# Patient Record
Sex: Female | Born: 1990 | Marital: Single | Smoking: Never smoker
Health system: Southern US, Community
[De-identification: ages and names within clinical notes are randomized; demographics above are authoritative.]

## PROBLEM LIST (undated history)

## (undated) ENCOUNTER — Inpatient Hospital Stay (HOSPITAL_COMMUNITY): Payer: Self-pay

## (undated) DIAGNOSIS — Z789 Other specified health status: Secondary | ICD-10-CM

## (undated) HISTORY — PX: NO PAST SURGERIES: SHX2092

---

## 2013-09-02 ENCOUNTER — Ambulatory Visit (INDEPENDENT_AMBULATORY_CARE_PROVIDER_SITE_OTHER): Payer: Medicaid Other | Admitting: Family Medicine

## 2013-09-02 VITALS — BP 122/73 | HR 118 | Temp 98.2°F | Resp 18 | Ht 62.21 in | Wt 91.0 lb

## 2013-09-02 DIAGNOSIS — K089 Disorder of teeth and supporting structures, unspecified: Secondary | ICD-10-CM

## 2013-09-02 DIAGNOSIS — L709 Acne, unspecified: Secondary | ICD-10-CM

## 2013-09-02 DIAGNOSIS — Z603 Acculturation difficulty: Secondary | ICD-10-CM

## 2013-09-02 DIAGNOSIS — L708 Other acne: Secondary | ICD-10-CM

## 2013-09-02 DIAGNOSIS — Z609 Problem related to social environment, unspecified: Secondary | ICD-10-CM

## 2013-09-02 DIAGNOSIS — K0889 Other specified disorders of teeth and supporting structures: Secondary | ICD-10-CM

## 2013-09-02 DIAGNOSIS — R630 Anorexia: Secondary | ICD-10-CM

## 2013-09-02 DIAGNOSIS — R6889 Other general symptoms and signs: Secondary | ICD-10-CM

## 2013-09-02 DIAGNOSIS — R000 Tachycardia, unspecified: Secondary | ICD-10-CM

## 2013-09-02 LAB — COMPREHENSIVE METABOLIC PANEL
ALBUMIN: 3.8 g/dL (ref 3.5–5.2)
ALT: 12 U/L (ref 0–35)
AST: 20 U/L (ref 0–37)
Alkaline Phosphatase: 43 U/L (ref 39–117)
BUN: 8 mg/dL (ref 6–23)
CALCIUM: 8.8 mg/dL (ref 8.4–10.5)
CO2: 25 mEq/L (ref 19–32)
Chloride: 101 mEq/L (ref 96–112)
Creat: 0.42 mg/dL — ABNORMAL LOW (ref 0.50–1.10)
Glucose, Bld: 74 mg/dL (ref 70–99)
POTASSIUM: 3.6 meq/L (ref 3.5–5.3)
SODIUM: 137 meq/L (ref 135–145)
TOTAL PROTEIN: 6.7 g/dL (ref 6.0–8.3)
Total Bilirubin: 0.5 mg/dL (ref 0.2–1.2)

## 2013-09-02 LAB — CBC WITH DIFFERENTIAL/PLATELET
BASOS ABS: 0 10*3/uL (ref 0.0–0.1)
BASOS PCT: 0 % (ref 0–1)
EOS ABS: 0 10*3/uL (ref 0.0–0.7)
Eosinophils Relative: 0 % (ref 0–5)
HCT: 37.8 % (ref 36.0–46.0)
Hemoglobin: 12.8 g/dL (ref 12.0–15.0)
Lymphocytes Relative: 24 % (ref 12–46)
Lymphs Abs: 3.1 10*3/uL (ref 0.7–4.0)
MCH: 28.3 pg (ref 26.0–34.0)
MCHC: 33.9 g/dL (ref 30.0–36.0)
MCV: 83.4 fL (ref 78.0–100.0)
Monocytes Absolute: 0.7 10*3/uL (ref 0.1–1.0)
Monocytes Relative: 5 % (ref 3–12)
NEUTROS PCT: 71 % (ref 43–77)
Neutro Abs: 9.1 10*3/uL — ABNORMAL HIGH (ref 1.7–7.7)
Platelets: 255 10*3/uL (ref 150–400)
RBC: 4.53 MIL/uL (ref 3.87–5.11)
RDW: 13.9 % (ref 11.5–15.5)
WBC: 12.9 10*3/uL — ABNORMAL HIGH (ref 4.0–10.5)

## 2013-09-02 LAB — POCT URINALYSIS DIPSTICK
Bilirubin, UA: NEGATIVE
Glucose, UA: NEGATIVE
KETONES UA: 40
Nitrite, UA: NEGATIVE
PH UA: 6
PROTEIN UA: NEGATIVE
Spec Grav, UA: 1.025
Urobilinogen, UA: 0.2

## 2013-09-02 LAB — POCT UA - MICROSCOPIC ONLY: WBC, Ur, HPF, POC: >20

## 2013-09-02 MED ORDER — BENZOYL PEROXIDE 10 % EX LIQD: CUTANEOUS | Status: DC

## 2013-09-02 MED ORDER — THERA VITAL M PO TABS: 1.0000 | ORAL_TABLET | Freq: Every day | ORAL | Status: DC

## 2013-09-02 MED ORDER — IBUPROFEN 600 MG PO TABS: 600.0000 mg | ORAL_TABLET | Freq: Three times a day (TID) | ORAL | Status: DC | PRN
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
## 2013-09-02 NOTE — Patient Instructions (Signed)
It was good to meet you today.  We will do lab tests today and talk about them next week.  Use the face wash each evening.  Take the medicine when your teeth hurt.  I will refer you to a dentist for Medicaid.

## 2013-09-03 LAB — TSH: TSH: 2.858 u[IU]/mL (ref 0.350–4.500)

## 2013-09-04 DIAGNOSIS — R000 Tachycardia, unspecified: Secondary | ICD-10-CM | POA: Insufficient documentation

## 2013-09-04 DIAGNOSIS — K0889 Other specified disorders of teeth and supporting structures: Secondary | ICD-10-CM | POA: Insufficient documentation

## 2013-09-04 DIAGNOSIS — Z603 Acculturation difficulty: Secondary | ICD-10-CM | POA: Insufficient documentation

## 2013-09-04 DIAGNOSIS — R6889 Other general symptoms and signs: Secondary | ICD-10-CM | POA: Insufficient documentation

## 2013-09-04 DIAGNOSIS — L709 Acne, unspecified: Secondary | ICD-10-CM | POA: Insufficient documentation
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
## 2013-09-04 NOTE — Assessment & Plan Note (Signed)
Benzoyl peroxide to start Will likely need more than this based on degree of acne, possibly antibiotic Discussed decreasing/stopping makeup use to prevent clogging of pores
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
## 2013-09-04 NOTE — Assessment & Plan Note (Signed)
Should be able to acquire dentist with refugee emergency medicaid.  Provided list of dentists which accept medicaid.

## 2013-10-07 ENCOUNTER — Encounter: Payer: Self-pay | Admitting: Family Medicine

## 2013-10-14 ENCOUNTER — Other Ambulatory Visit: Payer: Self-pay

## 2013-10-24 ENCOUNTER — Ambulatory Visit: Payer: Self-pay | Admitting: Family Medicine

## 2013-10-25 ENCOUNTER — Inpatient Hospital Stay (HOSPITAL_COMMUNITY): Payer: Medicaid Other

## 2013-10-25 ENCOUNTER — Encounter (HOSPITAL_COMMUNITY): Payer: Self-pay

## 2013-10-25 ENCOUNTER — Inpatient Hospital Stay (HOSPITAL_COMMUNITY)
Admission: AD | Admit: 2013-10-25 | Discharge: 2013-10-25 | Disposition: A | Discharge status: Home / Self Care | Payer: Medicaid Other | Source: Ambulatory Visit | Attending: Obstetrics & Gynecology | Admitting: Obstetrics & Gynecology

## 2013-10-25 ENCOUNTER — Encounter: Payer: Self-pay | Admitting: Advanced Practice Midwife

## 2013-10-25 DIAGNOSIS — O26899 Other specified pregnancy related conditions, unspecified trimester: Secondary | ICD-10-CM

## 2013-10-25 DIAGNOSIS — O4690 Antepartum hemorrhage, unspecified, unspecified trimester: Secondary | ICD-10-CM | POA: Insufficient documentation

## 2013-10-25 DIAGNOSIS — O0932 Supervision of pregnancy with insufficient antenatal care, second trimester: Secondary | ICD-10-CM | POA: Insufficient documentation

## 2013-10-25 DIAGNOSIS — O9989 Other specified diseases and conditions complicating pregnancy, childbirth and the puerperium: Secondary | ICD-10-CM

## 2013-10-25 DIAGNOSIS — IMO0002 Reserved for concepts with insufficient information to code with codable children: Secondary | ICD-10-CM | POA: Insufficient documentation

## 2013-10-25 DIAGNOSIS — O99891 Other specified diseases and conditions complicating pregnancy: Secondary | ICD-10-CM | POA: Insufficient documentation

## 2013-10-25 DIAGNOSIS — R109 Unspecified abdominal pain: Secondary | ICD-10-CM | POA: Insufficient documentation

## 2013-10-25 HISTORY — DX: Other specified health status: Z78.9

## 2013-10-25 LAB — CBC
HCT: 33.5 % — ABNORMAL LOW (ref 36.0–46.0)
HEMOGLOBIN: 11.4 g/dL — AB (ref 12.0–15.0)
MCH: 28.9 pg (ref 26.0–34.0)
MCHC: 34 g/dL (ref 30.0–36.0)
MCV: 84.8 fL (ref 78.0–100.0)
Platelets: 209 10*3/uL (ref 150–400)
RBC: 3.95 MIL/uL (ref 3.87–5.11)
RDW: 13.2 % (ref 11.5–15.5)
WBC: 10.4 10*3/uL (ref 4.0–10.5)

## 2013-10-25 LAB — URINALYSIS, ROUTINE W REFLEX MICROSCOPIC
Bilirubin Urine: NEGATIVE
GLUCOSE, UA: NEGATIVE mg/dL
KETONES UR: NEGATIVE mg/dL
NITRITE: NEGATIVE
Protein, ur: NEGATIVE mg/dL
Specific Gravity, Urine: >1.03 — ABNORMAL HIGH (ref 1.005–1.030)
Urobilinogen, UA: 0.2 mg/dL (ref 0.0–1.0)
pH: 6 (ref 5.0–8.0)

## 2013-10-25 LAB — URINE MICROSCOPIC-ADD ON

## 2013-10-25 LAB — WET PREP, GENITAL
Clue Cells Wet Prep HPF POC: NONE SEEN
Trich, Wet Prep: NONE SEEN

## 2013-10-25 LAB — DIFFERENTIAL
BASOS ABS: 0 10*3/uL (ref 0.0–0.1)
BASOS PCT: 0 % (ref 0–1)
EOS ABS: 0 10*3/uL (ref 0.0–0.7)
Eosinophils Relative: 0 % (ref 0–5)
Lymphocytes Relative: 20 % (ref 12–46)
Lymphs Abs: 2.1 10*3/uL (ref 0.7–4.0)
Monocytes Absolute: 0.8 10*3/uL (ref 0.1–1.0)
Monocytes Relative: 8 % (ref 3–12)
NEUTROS ABS: 7.5 10*3/uL (ref 1.7–7.7)
NEUTROS PCT: 72 % (ref 43–77)

## 2013-10-25 LAB — RAPID HIV SCREEN (WH-MAU): Rapid HIV Screen: NONREACTIVE

## 2013-10-25 LAB — TYPE AND SCREEN
ABO/RH(D): A POS
Antibody Screen: NEGATIVE

## 2013-10-25 LAB — OB RESULTS CONSOLE RUBELLA ANTIBODY, IGM: Rubella: IMMUNE

## 2013-10-25 LAB — HEPATITIS B SURFACE ANTIGEN: Hepatitis B Surface Ag: NEGATIVE

## 2013-10-25 LAB — OB RESULTS CONSOLE GC/CHLAMYDIA
CHLAMYDIA, DNA PROBE: NEGATIVE
Gonorrhea: NEGATIVE

## 2013-10-25 LAB — OB RESULTS CONSOLE HIV ANTIBODY (ROUTINE TESTING): HIV: NONREACTIVE

## 2013-10-25 LAB — OB RESULTS CONSOLE HEPATITIS B SURFACE ANTIGEN: HEP B S AG: NEGATIVE

## 2013-10-25 LAB — OB RESULTS CONSOLE ABO/RH: RH Type: POSITIVE

## 2013-10-25 LAB — OB RESULTS CONSOLE ANTIBODY SCREEN: ANTIBODY SCREEN: NEGATIVE

## 2013-10-25 LAB — OB RESULTS CONSOLE RPR: RPR: NONREACTIVE

## 2013-10-25 LAB — ABO/RH: ABO/RH(D): A POS

## 2013-10-25 MED ORDER — FLUCONAZOLE 150 MG PO TABS: 150.0000 mg | ORAL_TABLET | Freq: Once | ORAL | Status: DC

## 2013-10-25 NOTE — Discharge Instructions (Signed)
Second Trimester of Pregnancy The second trimester is from week 13 through week 28, months 4 through 6. The second trimester is often a time when you feel your best. Your body has also adjusted to being pregnant, and you begin to feel better physically. Usually, morning sickness has lessened or quit completely, you may have more energy, and you may have an increase in appetite. The second trimester is also a time when the fetus is growing rapidly. At the end of the sixth month, the fetus is about 9 inches long and weighs about 1 pounds. You will likely begin to feel the baby move (quickening) between 18 and 20 weeks of the pregnancy. BODY CHANGES Your body goes through many changes during pregnancy. The changes vary from woman to woman.   Your weight will continue to increase. You will notice your lower abdomen bulging out.  You may begin to get stretch marks on your hips, abdomen, and breasts.  You may develop headaches that can be relieved by medicines approved by your caregiver.  You may urinate more often because the fetus is pressing on your bladder.  You may develop or continue to have heartburn as a result of your pregnancy.  You may develop constipation because certain hormones are causing the muscles that push waste through your intestines to slow down.  You may develop hemorrhoids or swollen, bulging veins (varicose veins).  You may have back pain because of the weight gain and pregnancy hormones relaxing your joints between the bones in your pelvis and as a result of a shift in weight and the muscles that support your balance.  Your breasts will continue to grow and be tender.  Your gums may bleed and may be sensitive to brushing and flossing.  Dark spots or blotches (chloasma, mask of pregnancy) may develop on your face. This will likely fade after the baby is born.  A dark line from your belly button to the pubic area (linea nigra) may appear. This will likely fade after the  baby is born. WHAT TO EXPECT AT YOUR PRENATAL VISITS During a routine prenatal visit:  You will be weighed to make sure you and the fetus are growing normally.  Your blood pressure will be taken.  Your abdomen will be measured to track your baby's growth.  The fetal heartbeat will be listened to.  Any test results from the previous visit will be discussed. Your caregiver may ask you:  How you are feeling.  If you are feeling the baby move.  If you have had any abnormal symptoms, such as leaking fluid, bleeding, severe headaches, or abdominal cramping.  If you have any questions. Other tests that may be performed during your second trimester include:  Blood tests that check for:  Low iron levels (anemia).  Gestational diabetes (between 24 and 28 weeks).  Rh antibodies.  Urine tests to check for infections, diabetes, or protein in the urine.  An ultrasound to confirm the proper growth and development of the baby.  An amniocentesis to check for possible genetic problems.  Fetal screens for spina bifida and Down syndrome. HOME CARE INSTRUCTIONS   Avoid all smoking, herbs, alcohol, and unprescribed drugs. These chemicals affect the formation and growth of the baby.  Follow your caregiver's instructions regarding medicine use. There are medicines that are either safe or unsafe to take during pregnancy.  Exercise only as directed by your caregiver. Experiencing uterine cramps is a good sign to stop exercising.  Continue to eat regular,   healthy meals.  Wear a good support bra for breast tenderness.  Do not use hot tubs, steam rooms, or saunas.  Wear your seat belt at all times when driving.  Avoid raw meat, uncooked cheese, cat litter boxes, and soil used by cats. These carry germs that can cause birth defects in the baby.  Take your prenatal vitamins.  Try taking a stool softener (if your caregiver approves) if you develop constipation. Eat more high-fiber foods,  such as fresh vegetables or fruit and whole grains. Drink plenty of fluids to keep your urine clear or pale yellow.  Take warm sitz baths to soothe any pain or discomfort caused by hemorrhoids. Use hemorrhoid cream if your caregiver approves.  If you develop varicose veins, wear support hose. Elevate your feet for 15 minutes, 3 4 times a day. Limit salt in your diet.  Avoid heavy lifting, wear low heel shoes, and practice good posture.  Rest with your legs elevated if you have leg cramps or low back pain.  Visit your dentist if you have not gone yet during your pregnancy. Use a soft toothbrush to brush your teeth and be gentle when you floss.  A sexual relationship may be continued unless your caregiver directs you otherwise.  Continue to go to all your prenatal visits as directed by your caregiver. SEEK MEDICAL CARE IF:   You have dizziness.  You have mild pelvic cramps, pelvic pressure, or nagging pain in the abdominal area.  You have persistent nausea, vomiting, or diarrhea.  You have a bad smelling vaginal discharge.  You have pain with urination. SEEK IMMEDIATE MEDICAL CARE IF:   You have a fever.  You are leaking fluid from your vagina.  You have spotting or bleeding from your vagina.  You have severe abdominal cramping or pain.  You have rapid weight gain or loss.  You have shortness of breath with chest pain.  You notice sudden or extreme swelling of your face, hands, ankles, feet, or legs.  You have not felt your baby move in over an hour.  You have severe headaches that do not go away with medicine.  You have vision changes. Document Released: 07/05/2001 Document Revised: 03/13/2013 Document Reviewed: 09/11/2012 ExitCare Patient Information 2014 [employer].  
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
## 2013-10-26 LAB — RUBELLA SCREEN: RUBELLA: 1.31 {index} — AB (ref <0.90)

## 2013-10-26 LAB — RPR: RPR: NONREACTIVE

## 2013-10-26 LAB — GC/CHLAMYDIA PROBE AMP
CT Probe RNA: NEGATIVE
GC Probe RNA: NEGATIVE

## 2013-11-11 ENCOUNTER — Encounter: Payer: Medicaid Other | Admitting: Obstetrics & Gynecology

## 2013-11-11 ENCOUNTER — Encounter: Payer: Self-pay | Admitting: Obstetrics & Gynecology

## 2013-12-18 ENCOUNTER — Encounter: Payer: Self-pay | Admitting: General Practice

## 2013-12-20 ENCOUNTER — Encounter: Payer: Self-pay | Admitting: General Practice

## 2013-12-27 ENCOUNTER — Encounter (HOSPITAL_COMMUNITY): Payer: Self-pay | Admitting: *Deleted

## 2013-12-27 ENCOUNTER — Inpatient Hospital Stay (HOSPITAL_COMMUNITY)
Admission: AD | Admit: 2013-12-27 | Discharge: 2013-12-27 | Disposition: A | Discharge status: Home / Self Care | Payer: Medicaid Other | Source: Ambulatory Visit | Attending: Obstetrics and Gynecology | Admitting: Obstetrics and Gynecology

## 2013-12-27 DIAGNOSIS — B3731 Acute candidiasis of vulva and vagina: Secondary | ICD-10-CM

## 2013-12-27 DIAGNOSIS — N898 Other specified noninflammatory disorders of vagina: Secondary | ICD-10-CM

## 2013-12-27 DIAGNOSIS — B373 Candidiasis of vulva and vagina: Secondary | ICD-10-CM

## 2013-12-27 DIAGNOSIS — R109 Unspecified abdominal pain: Secondary | ICD-10-CM | POA: Insufficient documentation

## 2013-12-27 DIAGNOSIS — N949 Unspecified condition associated with female genital organs and menstrual cycle: Secondary | ICD-10-CM | POA: Insufficient documentation

## 2013-12-27 DIAGNOSIS — O9989 Other specified diseases and conditions complicating pregnancy, childbirth and the puerperium: Principal | ICD-10-CM

## 2013-12-27 DIAGNOSIS — O99891 Other specified diseases and conditions complicating pregnancy: Secondary | ICD-10-CM | POA: Insufficient documentation

## 2013-12-27 LAB — URINALYSIS, ROUTINE W REFLEX MICROSCOPIC
Bilirubin Urine: NEGATIVE
GLUCOSE, UA: NEGATIVE mg/dL
Ketones, ur: NEGATIVE mg/dL
Nitrite: NEGATIVE
PH: 6 (ref 5.0–8.0)
Protein, ur: NEGATIVE mg/dL
Specific Gravity, Urine: 1.025 (ref 1.005–1.030)
Urobilinogen, UA: 0.2 mg/dL (ref 0.0–1.0)

## 2013-12-27 LAB — URINE MICROSCOPIC-ADD ON

## 2013-12-27 LAB — POCT FERN TEST: POCT Fern Test: NEGATIVE

## 2013-12-27 LAB — WET PREP, GENITAL
CLUE CELLS WET PREP: NONE SEEN
Trich, Wet Prep: NONE SEEN

## 2013-12-27 MED ORDER — FLUCONAZOLE 150 MG PO TABS: 150.0000 mg | ORAL_TABLET | Freq: Once | ORAL | Status: DC

## 2013-12-27 MED ORDER — FLUCONAZOLE 150 MG PO TABS
150.0000 mg | ORAL_TABLET | Freq: Once | ORAL | Status: AC
  Administered 2013-12-27: 150 mg via ORAL
  Filled 2013-12-27: qty 1

## 2013-12-27 NOTE — MAU Provider Note (Signed)
Chief Complaint:  Vaginal Discharge and Abdominal Pain   [name] is a 23 y.o.  G1P0 with IUP at 7368w1d presenting for Vaginal Discharge and Abdominal Pain  Encounter done through the assistance of the arabic interpreter line and through assistance from the pt's aunt.  Pt states that this AM she woke up with fluid in her underwear and on the bed.  Since then has still had some discharge in her underwear. Hasn't had to wear a pad.  NO vb. +FM.  No CTX.  Also having some left leg pain and lower pelvic pain. States that has been present for the last 2 days.    Pt has an unfortunate social situation and lives with Celine Ahrunt but this pregnancy was the product of [place] and she is living in fear of what the men will do if they find out.  Was supposed to f/u in the clinic downstairs but states that they have tried multiple times to get an appointment and have been unable to.     Menstrual History: OB History   Grav Para Term Preterm Abortions TAB SAB Ect Mult Living   1                No LMP recorded. Patient is pregnant.      Past Medical History  Diagnosis Date  . Medical history non-contributory     Past Surgical History  Procedure Laterality Date  . No past surgeries      History reviewed. No pertinent family history.  History  Substance Use Topics  . Smoking status: Never Smoker   . Smokeless tobacco: Not on file  . Alcohol Use: No      Allergies  Allergen Reactions  . Penicillins Itching and Rash    Prescriptions prior to admission  Medication Sig Dispense Refill  . Prenatal Vit-Fe Fumarate-FA (PRENATAL MULTIVITAMIN) TABS tablet Take 1 tablet by mouth daily at 12 noon.        Review of Systems - Negative except for what is mentioned in HPI.  Physical Exam  Blood pressure 108/65, pulse 106, temperature 98.4 F (36.9 C), temperature source Oral, resp. rate 18, SpO2 98.00%. GENERAL: Well-developed, well-nourished female in no acute distress.  LUNGS:  Clear to auscultation bilaterally.  HEART: Regular rate and rhythm. ABDOMEN: Soft, slight tenderness in bilateral inguinal canals, no abdominal tenderness, nondistended, gravid.  EXTREMITIES: Nontender, no edema, 2+ distal pulses. GU: external genitalia with evidence of thick curdish discharge in her vault. Vagina clear with thick curdish yellow/green discharge throughout. Friable cervix that appears closed. nontender on bimanual exam.   Cervical Exam: Dilatation 0cm   Effacement thick%   Station high    FHT:  Baseline rate 140 bpm   Variability moderate  Accelerations present   Decelerations none Contractions: irritability    Labs: No results found for this or any previous visit (from the past 24 hour(s)).  Imaging Studies:  No results found.  Assessment: [name] is  23 y.o. G1P0 at 3068w1d presents with Vaginal Discharge and Abdominal Pain .  Plan:  1) r/o rupture - neg pool, fern  - vault full of discharge that is consistent more with yeast - diflucan given here - gc/chl sent due to friability of the cervix - reassurance provided.   2) lower abdominal pain - likely more MSK related - UA sent and appears dehydrated with some leukocytes. Will send for culture.    3) FWB - reassuring for gestational age - planning on  adoption of this baby so discussed with social work about coming prior to d/c   4) clinic flagged for appt.    Vale Haven 6/5/20153:46 PM

## 2013-12-27 NOTE — MAU Note (Signed)
Abdominal pain since yesterday, back pain & L leg pain started today.   ? LOF this a.m., had large gush of clear fluid, still leaking now.   BP @ home high, taken by aunt.  Denies bleeding.

## 2013-12-27 NOTE — Discharge Instructions (Signed)
Candidal Vulvovaginitis  Candidal vulvovaginitis is an infection of the vagina and vulva. The vulva is the skin around the opening of the vagina. This may cause itching and discomfort in and around the vagina.   HOME CARE  · Only take medicine as told by your doctor.  · Do not have sex (intercourse) until the infection is healed or as told by your doctor.  · Practice safe sex.  · Tell your sex partner about your infection.  · Do not douche or use tampons.  · Wear cotton underwear. Do not wear tight pants or panty hose.  · Eat yogurt. This may help treat and prevent yeast infections.  GET HELP RIGHT AWAY IF:   · You have a fever.  · Your problems get worse during treatment or do not get better in 3 days.  · You have discomfort, irritation, or itching in your vagina or vulva area.  · You have pain after sex.  · You start to get belly (abdominal) pain.  MAKE SURE YOU:  · Understand these instructions.  · Will watch your condition.  · Will get help right away if you are not doing well or get worse.  Document Released: 10/07/2008 Document Revised: 10/03/2011 Document Reviewed: 10/07/2008  ExitCare® Patient Information ©2014 [employer].

## 2013-12-27 NOTE — MAU Note (Signed)
Pt & her aunt state there is another aunt in the family who wants to take custody of the baby.  Aunt requesting forms.  Johnnette Barrios, CSW informed & is coming to see pt.

## 2013-12-28 LAB — URINE CULTURE
Colony Count: 45000
SPECIAL REQUESTS: NORMAL

## 2013-12-28 LAB — GC/CHLAMYDIA PROBE AMP
CT PROBE, AMP APTIMA: NEGATIVE
GC Probe RNA: NEGATIVE

## 2014-01-13 ENCOUNTER — Encounter: Payer: Medicaid Other | Admitting: Family Medicine

## 2014-01-21 ENCOUNTER — Encounter (HOSPITAL_COMMUNITY): Payer: Self-pay | Admitting: General Practice

## 2014-01-21 ENCOUNTER — Inpatient Hospital Stay (HOSPITAL_COMMUNITY)
Admission: AD | Admit: 2014-01-21 | Discharge: 2014-01-21 | Disposition: A | Discharge status: Home / Self Care | Payer: Medicaid Other | Source: Ambulatory Visit | Attending: Obstetrics & Gynecology | Admitting: Obstetrics & Gynecology

## 2014-01-21 ENCOUNTER — Encounter: Payer: Medicaid Other | Admitting: Obstetrics and Gynecology

## 2014-01-21 DIAGNOSIS — O9989 Other specified diseases and conditions complicating pregnancy, childbirth and the puerperium: Principal | ICD-10-CM

## 2014-01-21 DIAGNOSIS — O99891 Other specified diseases and conditions complicating pregnancy: Secondary | ICD-10-CM | POA: Diagnosis present

## 2014-01-21 DIAGNOSIS — R109 Unspecified abdominal pain: Secondary | ICD-10-CM | POA: Diagnosis not present

## 2014-01-21 LAB — URINALYSIS, ROUTINE W REFLEX MICROSCOPIC
Bilirubin Urine: NEGATIVE
Glucose, UA: NEGATIVE mg/dL
HGB URINE DIPSTICK: NEGATIVE
Ketones, ur: NEGATIVE mg/dL
Nitrite: NEGATIVE
PROTEIN: NEGATIVE mg/dL
SPECIFIC GRAVITY, URINE: 1.025 (ref 1.005–1.030)
UROBILINOGEN UA: 0.2 mg/dL (ref 0.0–1.0)
pH: 6 (ref 5.0–8.0)

## 2014-01-21 LAB — URINE MICROSCOPIC-ADD ON

## 2014-01-21 NOTE — MAU Note (Signed)
Pt

## 2014-01-21 NOTE — MAU Note (Signed)
Pt present to MAU with neighbor for c/o cramping.Neighbor states she is 7 months pregnant.

## 2014-01-21 NOTE — Discharge Instructions (Signed)
Braxton Hicks Contractions °Contractions of the uterus can occur throughout pregnancy. Contractions are not always a sign that you are in labor.  °WHAT ARE BRAXTON HICKS CONTRACTIONS?  °Contractions that occur before labor are called Braxton Hicks contractions, or false labor. Toward the end of pregnancy (32-34 weeks), these contractions can develop more often and may become more forceful. This is not true labor because these contractions do not result in opening (dilatation) and thinning of the cervix. They are sometimes difficult to tell apart from true labor because these contractions can be forceful and people have different pain tolerances. You should not feel embarrassed if you go to the hospital with false labor. Sometimes, the only way to tell if you are in true labor is for your health care provider to look for changes in the cervix. °If there are no prenatal problems or other health problems associated with the pregnancy, it is completely safe to be sent home with false labor and await the onset of true labor. °HOW CAN YOU TELL THE DIFFERENCE BETWEEN TRUE AND FALSE LABOR? °False Labor °· The contractions of false labor are usually shorter and not as hard as those of true labor.   °· The contractions are usually irregular.   °· The contractions are often felt in the front of the lower abdomen and in the groin.   °· The contractions may go away when you walk around or change positions while lying down.   °· The contractions get weaker and are shorter lasting as time goes on.   °· The contractions do not usually become progressively stronger, regular, and closer together as with true labor.   °True Labor °· Contractions in true labor last 30-70 seconds, become very regular, usually become more intense, and increase in frequency.   °· The contractions do not go away with walking.   °· The discomfort is usually felt in the top of the uterus and spreads to the lower abdomen and low back.   °· True labor can be  determined by your health care provider with an exam. This will show that the cervix is dilating and getting thinner.   °WHAT TO REMEMBER °· Keep up with your usual exercises and follow other instructions given by your health care provider.   °· Take medicines as directed by your health care provider.   °· Keep your regular prenatal appointments.   °· Eat and drink lightly if you think you are going into labor.   °· If Braxton Hicks contractions are making you uncomfortable:   °¨ Change your position from lying down or resting to walking, or from walking to resting.   °¨ Sit and rest in a tub of warm water.   °¨ Drink 2-3 glasses of water. Dehydration may cause these contractions.   °¨ Do slow and deep breathing several times an hour.   °WHEN SHOULD I SEEK IMMEDIATE MEDICAL CARE? °Seek immediate medical care if: °· Your contractions become stronger, more regular, and closer together.   °· You have fluid leaking or gushing from your vagina.   °· You have a fever.   °· You pass blood-tinged mucus.   °· You have vaginal bleeding.   °· You have continuous abdominal pain.   °· You have low back pain that you never had before.   °· You feel your baby's head pushing down and causing pelvic pressure.   °· Your baby is not moving as much as it used to.   °Document Released: 07/11/2005 Document Revised: 07/16/2013 Document Reviewed: 04/22/2013 °ExitCare® Patient Information ©2015 [employer]. This information is not intended to replace advice given to you by your health care   provider. Make sure you discuss any questions you have with your health care provider. ° °

## 2014-02-03 ENCOUNTER — Inpatient Hospital Stay (HOSPITAL_COMMUNITY)
Admission: AD | Admit: 2014-02-03 | Discharge: 2014-02-03 | Disposition: A | Discharge status: Home / Self Care | Payer: Medicaid Other | Source: Ambulatory Visit | Attending: Family Medicine | Admitting: Family Medicine

## 2014-02-03 ENCOUNTER — Encounter: Payer: Self-pay | Admitting: Obstetrics and Gynecology

## 2014-02-03 ENCOUNTER — Encounter (HOSPITAL_COMMUNITY): Payer: Self-pay | Admitting: Obstetrics and Gynecology

## 2014-02-03 ENCOUNTER — Ambulatory Visit: Payer: Medicaid Other | Admitting: Family Medicine

## 2014-02-03 ENCOUNTER — Inpatient Hospital Stay (HOSPITAL_COMMUNITY): Payer: Medicaid Other

## 2014-02-03 VITALS — BP 122/86 | HR 100 | Temp 97.5°F | Wt 114.3 lb

## 2014-02-03 DIAGNOSIS — O368131 Decreased fetal movements, third trimester, fetus 1: Secondary | ICD-10-CM

## 2014-02-03 DIAGNOSIS — O0933 Supervision of pregnancy with insufficient antenatal care, third trimester: Secondary | ICD-10-CM | POA: Insufficient documentation

## 2014-02-03 DIAGNOSIS — O368190 Decreased fetal movements, unspecified trimester, not applicable or unspecified: Secondary | ICD-10-CM | POA: Diagnosis not present

## 2014-02-03 DIAGNOSIS — O3090 Multiple gestation, unspecified, unspecified trimester: Secondary | ICD-10-CM

## 2014-02-03 DIAGNOSIS — Z3493 Encounter for supervision of normal pregnancy, unspecified, third trimester: Secondary | ICD-10-CM

## 2014-02-03 LAB — COMPREHENSIVE METABOLIC PANEL
ALBUMIN: 2.5 g/dL — AB (ref 3.5–5.2)
ALT: 10 U/L (ref 0–35)
ANION GAP: 13 (ref 5–15)
AST: 19 U/L (ref 0–37)
Alkaline Phosphatase: 147 U/L — ABNORMAL HIGH (ref 39–117)
BILIRUBIN TOTAL: 0.3 mg/dL (ref 0.3–1.2)
BUN: 10 mg/dL (ref 6–23)
CHLORIDE: 100 meq/L (ref 96–112)
CO2: 21 mEq/L (ref 19–32)
CREATININE: 0.5 mg/dL (ref 0.50–1.10)
Calcium: 8.9 mg/dL (ref 8.4–10.5)
GFR calc Af Amer: >90 mL/min (ref >90)
GFR calc non Af Amer: >90 mL/min (ref >90)
Glucose, Bld: 117 mg/dL — ABNORMAL HIGH (ref 70–99)
Potassium: 3.8 mEq/L (ref 3.7–5.3)
Sodium: 134 mEq/L — ABNORMAL LOW (ref 137–147)
TOTAL PROTEIN: 6.4 g/dL (ref 6.0–8.3)

## 2014-02-03 LAB — POCT URINALYSIS DIP (DEVICE)
GLUCOSE, UA: NEGATIVE mg/dL
HGB URINE DIPSTICK: NEGATIVE
NITRITE: NEGATIVE
Protein, ur: 100 mg/dL — AB
Specific Gravity, Urine: >=1.03 (ref 1.005–1.030)
Urobilinogen, UA: 1 mg/dL (ref 0.0–1.0)
pH: 6 (ref 5.0–8.0)

## 2014-02-03 LAB — CBC
HEMATOCRIT: 33.6 % — AB (ref 36.0–46.0)
Hemoglobin: 10.9 g/dL — ABNORMAL LOW (ref 12.0–15.0)
MCH: 23.8 pg — ABNORMAL LOW (ref 26.0–34.0)
MCHC: 32.4 g/dL (ref 30.0–36.0)
MCV: 73.4 fL — AB (ref 78.0–100.0)
PLATELETS: 240 10*3/uL (ref 150–400)
RBC: 4.58 MIL/uL (ref 3.87–5.11)
RDW: 14.7 % (ref 11.5–15.5)
WBC: 11.4 10*3/uL — AB (ref 4.0–10.5)

## 2014-02-03 LAB — GLUCOSE, CAPILLARY: GLUCOSE-CAPILLARY: 93 mg/dL (ref 70–99)

## 2014-02-03 LAB — PROTEIN / CREATININE RATIO, URINE
CREATININE, URINE: 34.65 mg/dL
PROTEIN CREATININE RATIO: 0.16 — AB (ref 0.00–0.15)
TOTAL PROTEIN, URINE: 5.6 mg/dL

## 2014-02-03 NOTE — MAU Provider Note (Signed)
Chief Complaint:  Decreased Fetal Movement   First Provider Initiated Contact with Patient 02/03/14 1636    While waiting for Language line, family member for interpreter Note: prior MAU records are under the name [name]  HPI: Mary SangerFatema S Love is a 23 y.o. G1P0 at 9884w5d who is [facility]. Has had PN labs at earlier MAU visit. Has not had 1 hr glucola. Denies H/A, visual sx, abd pain.   Denies contractions, leakage of fluid or vaginal bleeding.   Pregnancy Course: No regular PNC. Significant social-cultural issues( See prior SW notes).   Past Medical History: Past Medical History  Diagnosis Date  . Medical history non-contributory     Past obstetric history: OB History  Gravida Para Term Preterm AB SAB TAB Ectopic Multiple Living  1             # Outcome Date GA Lbr Len/2nd Weight Sex Delivery Anes PTL Lv  1 CUR               Past Surgical History: Past Surgical History  Procedure Laterality Date  . No past surgeries       Family History: History reviewed. No pertinent family history.  Social History: History  Substance Use Topics  . Smoking status: Never Smoker   . Smokeless tobacco: Never Used  . Alcohol Use: No    Allergies:  Allergies  Allergen Reactions  . Eggs Or Egg-Derived Products Hives and Itching  . Penicillins Hives and Itching    Meds:  Prescriptions prior to admission  Medication Sig Dispense Refill  . Prenatal Vit-Fe Fumarate-FA (PRENATAL MULTIVITAMIN) TABS tablet Take 1 tablet by mouth daily at 12 noon.        ROS: Pertinent findings in history of present illness.  Physical Exam  Blood pressure 124/89, pulse 107, temperature 98.3 F (36.8 C), temperature source Oral, resp. rate 16, height 5\' 1"  (1.549 m), weight 52.164 kg (115 lb), last menstrual period 06/08/2013. GENERAL: Well-developed, well-nourished female in no acute distress.  HEENT: normocephalic HEART: normal rate RESP: normal effort ABDOMEN: Soft,  non-tender, appropriate for gestational age EXTREMITIES: Nontender, no edema NEURO: alert and oriented      FHT:  Baseline 130 , moderate variability, accelerations present, no decelerations Contractions: UI   Labs: Results for orders placed during the hospital encounter of 02/03/14 (from the past 24 hour(s))  CBC     Status: Abnormal   Collection Time    02/03/14  5:10 PM      Result Value Ref Range   WBC 11.4 (*) 4.0 - 10.5 K/uL   RBC 4.58  3.87 - 5.11 MIL/uL   Hemoglobin 10.9 (*) 12.0 - 15.0 g/dL   HCT 21.333.6 (*) 08.636.0 - 57.846.0 %   MCV 73.4 (*) 78.0 - 100.0 fL   MCH 23.8 (*) 26.0 - 34.0 pg   MCHC 32.4  30.0 - 36.0 g/dL   RDW 46.914.7  62.911.5 - 52.815.5 %   Platelets 240  150 - 400 K/uL  COMPREHENSIVE METABOLIC PANEL     Status: Abnormal   Collection Time    02/03/14  5:10 PM      Result Value Ref Range   Sodium 134 (*) 137 - 147 mEq/L   Potassium 3.8  3.7 - 5.3 mEq/L   Chloride 100  96 - 112 mEq/L   CO2 21  19 - 32 mEq/L   Glucose, Bld 117 (*) 70 - 99 mg/dL   BUN 10  6 - 23 mg/dL   Creatinine, Ser 4.09  0.50 - 1.10 mg/dL   Calcium 8.9  8.4 - 81.1 mg/dL   Total Protein 6.4  6.0 - 8.3 g/dL   Albumin 2.5 (*) 3.5 - 5.2 g/dL   AST 19  0 - 37 U/L   ALT 10  0 - 35 U/L   Alkaline Phosphatase 147 (*) 39 - 117 U/L   Total Bilirubin 0.3  0.3 - 1.2 mg/dL   GFR calc non Af Amer >90  >90 mL/min   GFR calc Af Amer >90  >90 mL/min   Anion gap 13  5 - 15  GLUCOSE, CAPILLARY     Status: None   Collection Time    02/03/14  5:22 PM      Result Value Ref Range   Glucose-Capillary 93  70 - 99 mg/dL  PROTEIN / CREATININE RATIO, URINE     Status: Abnormal   Collection Time    02/03/14  5:25 PM      Result Value Ref Range   Creatinine, Urine 34.65     Total Protein, Urine 5.6     PROTEIN CREATININE RATIO 0.16 (*) 0.00 - 0.15    Imaging:  BPP 8/8  MAU Course: Not feeling any FM while in MAU> to [place] for BPP Consulted Dr. Shawnie Pons  Assessment: 1. Decreased fetal movement in pregnancy, third  trimester, fetus 1   G1 at [redacted]w[redacted]d by 24 wk [place] here.  Category 1 FHR  Plan: Discharge home Labor precautions and fetal kick counts    Medication List         prenatal multivitamin Tabs tablet  Take 1 tablet by mouth daily at 12 noon.      Will ask for merge of PN record Follow-up Information   Follow up with [facility] In 4 days. (Someone from Clinic will call you with appt.)    Contact information:   [address] [place] [address]      Danae Orleans, CNM 02/03/2014 4:36 PM

## 2014-02-03 NOTE — Patient Instructions (Signed)
Third Trimester of Pregnancy The third trimester is from week 29 through week 42, months 7 through 9. The third trimester is a time when the fetus is growing rapidly. At the end of the ninth month, the fetus is about 20 inches in length and weighs 6-10 pounds.  BODY CHANGES Your body goes through many changes during pregnancy. The changes vary from woman to woman.   Your weight will continue to increase. You can expect to gain 25-35 pounds (11-16 kg) by the end of the pregnancy.  You may begin to get stretch marks on your hips, abdomen, and breasts.  You may urinate more often because the fetus is moving lower into your pelvis and pressing on your bladder.  You may develop or continue to have heartburn as a result of your pregnancy.  You may develop constipation because certain hormones are causing the muscles that push waste through your intestines to slow down.  You may develop hemorrhoids or swollen, bulging veins (varicose veins).  You may have pelvic pain because of the weight gain and pregnancy hormones relaxing your joints between the bones in your pelvis. Backaches may result from overexertion of the muscles supporting your posture.  You may have changes in your hair. These can include thickening of your hair, rapid growth, and changes in texture. Some women also have hair loss during or after pregnancy, or hair that feels dry or thin. Your hair will most likely return to normal after your baby is born.  Your breasts will continue to grow and be tender. A yellow discharge may leak from your breasts called colostrum.  Your belly button may stick out.  You may feel short of breath because of your expanding uterus.  You may notice the fetus "dropping," or moving lower in your abdomen.  You may have a bloody mucus discharge. This usually occurs a few days to a week before labor begins.  Your cervix becomes thin and soft (effaced) near your due date. WHAT TO EXPECT AT YOUR PRENATAL  EXAMS  You will have prenatal exams every 2 weeks until week 36. Then, you will have weekly prenatal exams. During a routine prenatal visit:  You will be weighed to make sure you and the fetus are growing normally.  Your blood pressure is taken.  Your abdomen will be measured to track your baby's growth.  The fetal heartbeat will be listened to.  Any test results from the previous visit will be discussed.  You may have a cervical check near your due date to see if you have effaced. At around 36 weeks, your caregiver will check your cervix. At the same time, your caregiver will also perform a test on the secretions of the vaginal tissue. This test is to determine if a type of bacteria, Group B streptococcus, is present. Your caregiver will explain this further. Your caregiver may ask you:  What your birth plan is.  How you are feeling.  If you are feeling the baby move.  If you have had any abnormal symptoms, such as leaking fluid, bleeding, severe headaches, or abdominal cramping.  If you have any questions. Other tests or screenings that may be performed during your third trimester include:  Blood tests that check for low iron levels (anemia).  Fetal testing to check the health, activity level, and growth of the fetus. Testing is done if you have certain medical conditions or if there are problems during the pregnancy. FALSE LABOR You may feel small, irregular contractions that   eventually go away. These are called Braxton Hicks contractions, or false labor. Contractions may last for hours, days, or even weeks before true labor sets in. If contractions come at regular intervals, intensify, or become painful, it is best to be seen by your caregiver.  SIGNS OF LABOR   Menstrual-like cramps.  Contractions that are 5 minutes apart or less.  Contractions that start on the top of the uterus and spread down to the lower abdomen and back.  A sense of increased pelvic pressure or back  pain.  A watery or bloody mucus discharge that comes from the vagina. If you have any of these signs before the 37th week of pregnancy, call your caregiver right away. You need to go to the hospital to get checked immediately. HOME CARE INSTRUCTIONS   Avoid all smoking, herbs, alcohol, and unprescribed drugs. These chemicals affect the formation and growth of the baby.  Follow your caregiver's instructions regarding medicine use. There are medicines that are either safe or unsafe to take during pregnancy.  Exercise only as directed by your caregiver. Experiencing uterine cramps is a good sign to stop exercising.  Continue to eat regular, healthy meals.  Wear a good support bra for breast tenderness.  Do not use hot tubs, steam rooms, or saunas.  Wear your seat belt at all times when driving.  Avoid raw meat, uncooked cheese, cat litter boxes, and soil used by cats. These carry germs that can cause birth defects in the baby.  Take your prenatal vitamins.  Try taking a stool softener (if your caregiver approves) if you develop constipation. Eat more high-fiber foods, such as fresh vegetables or fruit and whole grains. Drink plenty of fluids to keep your urine clear or pale yellow.  Take warm sitz baths to soothe any pain or discomfort caused by hemorrhoids. Use hemorrhoid cream if your caregiver approves.  If you develop varicose veins, wear support hose. Elevate your feet for 15 minutes, 3-4 times a day. Limit salt in your diet.  Avoid heavy lifting, wear low heal shoes, and practice good posture.  Rest a lot with your legs elevated if you have leg cramps or low back pain.  Visit your dentist if you have not gone during your pregnancy. Use a soft toothbrush to brush your teeth and be gentle when you floss.  A sexual relationship may be continued unless your caregiver directs you otherwise.  Do not travel far distances unless it is absolutely necessary and only with the approval  of your caregiver.  Take prenatal classes to understand, practice, and ask questions about the labor and delivery.  Make a trial run to the hospital.  Pack your hospital bag.  Prepare the baby's nursery.  Continue to go to all your prenatal visits as directed by your caregiver. SEEK MEDICAL CARE IF:  You are unsure if you are in labor or if your water has broken.  You have dizziness.  You have mild pelvic cramps, pelvic pressure, or nagging pain in your abdominal area.  You have persistent nausea, vomiting, or diarrhea.  You have a bad smelling vaginal discharge.  You have pain with urination. SEEK IMMEDIATE MEDICAL CARE IF:   You have a fever.  You are leaking fluid from your vagina.  You have spotting or bleeding from your vagina.  You have severe abdominal cramping or pain.  You have rapid weight loss or gain.  You have shortness of breath with chest pain.  You notice sudden or extreme swelling   of your face, hands, ankles, feet, or legs.  You have not felt your baby move in over an hour.  You have severe headaches that do not go away with medicine.  You have vision changes. Document Released: 07/05/2001 Document Revised: 07/16/2013 Document Reviewed: 09/11/2012 ExitCare Patient Information 2015 [employer]. This information is not intended to replace advice given to you by your health care provider. Make sure you discuss any questions you have with your health care provider.  

## 2014-02-03 NOTE — MAU Note (Addendum)
Patient presents with complaint of decreased fetal movement.

## 2014-02-03 NOTE — Progress Notes (Signed)
Pt was scheduled for a NOBV today at 3833w4d but states she hasn't felt the baby move in days. As such, was sent to MAU for evaluation.

## 2014-02-03 NOTE — Progress Notes (Signed)
Pt states that she does not feel the baby move, only pain in lower abdomen.  Pt cannot verbalize last time she felt the baby move, but reports feeling the baby moving previously.  Discussed with Dr. Reola CalkinsBeck the lack of movement and she states the patient should be seen in MAU.

## 2014-02-03 NOTE — Discharge Instructions (Signed)
Fetal Movement Counts °Patient Name: __________________________________________________ Patient Due Date: ____________________ °Performing a fetal movement count is highly recommended in high-risk pregnancies, but it is good for every pregnant woman to do. Your caregiver may ask you to start counting fetal movements at 28 weeks of the pregnancy. Fetal movements often increase: °· After eating a full meal. °· After physical activity. °· After eating or drinking something sweet or cold. °· At rest. °Pay attention to when you feel the baby is most active. This will help you notice a pattern of your baby's sleep and wake cycles and what factors contribute to an increase in fetal movement. It is important to perform a fetal movement count at the same time each day when your baby is normally most active.  °HOW TO COUNT FETAL MOVEMENTS °1. Find a quiet and comfortable area to sit or lie down on your left side. Lying on your left side provides the best blood and oxygen circulation to your baby. °2. Write down the day and time on a sheet of paper or in a journal. °3. Start counting kicks, flutters, swishes, rolls, or jabs in a 2 hour period. You should feel at least 10 movements within 2 hours. °4. If you do not feel 10 movements in 2 hours, wait 2-3 hours and count again. Look for a change in the pattern or not enough counts in 2 hours. °SEEK MEDICAL CARE IF: °· You feel less than 10 counts in 2 hours, tried twice. °· There is no movement in over an hour. °· The pattern is changing or taking longer each day to reach 10 counts in 2 hours. °· You feel the baby is not moving as he or she usually does. °Date: ____________ Movements: ____________ Start time: ____________ Finish time: ____________  °Date: ____________ Movements: ____________ Start time: ____________ Finish time: ____________ °Date: ____________ Movements: ____________ Start time: ____________ Finish time: ____________ °Date: ____________ Movements: ____________  Start time: ____________ Finish time: ____________ °Date: ____________ Movements: ____________ Start time: ____________ Finish time: ____________ °Date: ____________ Movements: ____________ Start time: ____________ Finish time: ____________ °Date: ____________ Movements: ____________ Start time: ____________ Finish time: ____________ °Date: ____________ Movements: ____________ Start time: ____________ Finish time: ____________  °Date: ____________ Movements: ____________ Start time: ____________ Finish time: ____________ °Date: ____________ Movements: ____________ Start time: ____________ Finish time: ____________ °Date: ____________ Movements: ____________ Start time: ____________ Finish time: ____________ °Date: ____________ Movements: ____________ Start time: ____________ Finish time: ____________ °Date: ____________ Movements: ____________ Start time: ____________ Finish time: ____________ °Date: ____________ Movements: ____________ Start time: ____________ Finish time: ____________ °Date: ____________ Movements: ____________ Start time: ____________ Finish time: ____________  °Date: ____________ Movements: ____________ Start time: ____________ Finish time: ____________ °Date: ____________ Movements: ____________ Start time: ____________ Finish time: ____________ °Date: ____________ Movements: ____________ Start time: ____________ Finish time: ____________ °Date: ____________ Movements: ____________ Start time: ____________ Finish time: ____________ °Date: ____________ Movements: ____________ Start time: ____________ Finish time: ____________ °Date: ____________ Movements: ____________ Start time: ____________ Finish time: ____________ °Date: ____________ Movements: ____________ Start time: ____________ Finish time: ____________  °Date: ____________ Movements: ____________ Start time: ____________ Finish time: ____________ °Date: ____________ Movements: ____________ Start time: ____________ Finish time:  ____________ °Date: ____________ Movements: ____________ Start time: ____________ Finish time: ____________ °Date: ____________ Movements: ____________ Start time: ____________ Finish time: ____________ °Date: ____________ Movements: ____________ Start time: ____________ Finish time: ____________ °Date: ____________ Movements: ____________ Start time: ____________ Finish time: ____________ °Date: ____________ Movements: ____________ Start time: ____________ Finish time: ____________  °Date: ____________ Movements: ____________ Start time: ____________ Finish time:   ____________ °Date: ____________ Movements: ____________ Start time: ____________ Finish time: ____________ °Date: ____________ Movements: ____________ Start time: ____________ Finish time: ____________ °Date: ____________ Movements: ____________ Start time: ____________ Finish time: ____________ °Date: ____________ Movements: ____________ Start time: ____________ Finish time: ____________ °Date: ____________ Movements: ____________ Start time: ____________ Finish time: ____________ °Date: ____________ Movements: ____________ Start time: ____________ Finish time: ____________  °Date: ____________ Movements: ____________ Start time: ____________ Finish time: ____________ °Date: ____________ Movements: ____________ Start time: ____________ Finish time: ____________ °Date: ____________ Movements: ____________ Start time: ____________ Finish time: ____________ °Date: ____________ Movements: ____________ Start time: ____________ Finish time: ____________ °Date: ____________ Movements: ____________ Start time: ____________ Finish time: ____________ °Date: ____________ Movements: ____________ Start time: ____________ Finish time: ____________ °Date: ____________ Movements: ____________ Start time: ____________ Finish time: ____________  °Date: ____________ Movements: ____________ Start time: ____________ Finish time: ____________ °Date: ____________ Movements:  ____________ Start time: ____________ Finish time: ____________ °Date: ____________ Movements: ____________ Start time: ____________ Finish time: ____________ °Date: ____________ Movements: ____________ Start time: ____________ Finish time: ____________ °Date: ____________ Movements: ____________ Start time: ____________ Finish time: ____________ °Date: ____________ Movements: ____________ Start time: ____________ Finish time: ____________ °Date: ____________ Movements: ____________ Start time: ____________ Finish time: ____________  °Date: ____________ Movements: ____________ Start time: ____________ Finish time: ____________ °Date: ____________ Movements: ____________ Start time: ____________ Finish time: ____________ °Date: ____________ Movements: ____________ Start time: ____________ Finish time: ____________ °Date: ____________ Movements: ____________ Start time: ____________ Finish time: ____________ °Date: ____________ Movements: ____________ Start time: ____________ Finish time: ____________ °Date: ____________ Movements: ____________ Start time: ____________ Finish time: ____________ °Document Released: 08/10/2006 Document Revised: 06/27/2012 Document Reviewed: 05/07/2012 °[employer]® Patient Information ©2015 [employer]. This information is not intended to replace advice given to you by your health care provider. Make sure you discuss any questions you have with your health care provider. ° °

## 2014-02-05 NOTE — MAU Provider Note (Signed)
Attestation of Attending Supervision of Advanced Practitioner (PA/CNM/NP): Evaluation and management procedures were performed by the Advanced Practitioner under my supervision and collaboration.  I have reviewed the Advanced Practitioner's note and chart, and I agree with the management and plan.  Reva BoresPRATT,Allee Busk S, MD [facility] Attending 02/05/2014 10:13 AM

## 2014-02-06 ENCOUNTER — Encounter (HOSPITAL_COMMUNITY): Payer: Medicaid Other | Admitting: Anesthesiology

## 2014-02-06 ENCOUNTER — Inpatient Hospital Stay (HOSPITAL_COMMUNITY): Payer: Medicaid Other | Admitting: Anesthesiology

## 2014-02-06 ENCOUNTER — Encounter (HOSPITAL_COMMUNITY): Payer: Self-pay | Admitting: *Deleted

## 2014-02-06 ENCOUNTER — Inpatient Hospital Stay (HOSPITAL_COMMUNITY)
Admission: AD | Admit: 2014-02-06 | Discharge: 2014-02-09 | DRG: 765 | Disposition: A | Discharge status: Home / Self Care | Payer: Medicaid Other | Source: Ambulatory Visit | Attending: Obstetrics & Gynecology | Admitting: Obstetrics & Gynecology

## 2014-02-06 DIAGNOSIS — IMO0001 Reserved for inherently not codable concepts without codable children: Secondary | ICD-10-CM

## 2014-02-06 DIAGNOSIS — O411090 Infection of amniotic sac and membranes, unspecified, unspecified trimester, not applicable or unspecified: Secondary | ICD-10-CM | POA: Diagnosis present

## 2014-02-06 DIAGNOSIS — O479 False labor, unspecified: Secondary | ICD-10-CM | POA: Diagnosis present

## 2014-02-06 DIAGNOSIS — O0930 Supervision of pregnancy with insufficient antenatal care, unspecified trimester: Secondary | ICD-10-CM | POA: Diagnosis not present

## 2014-02-06 DIAGNOSIS — O324XX0 Maternal care for high head at term, not applicable or unspecified: Secondary | ICD-10-CM | POA: Diagnosis present

## 2014-02-06 LAB — CBC
HCT: 35.5 % — ABNORMAL LOW (ref 36.0–46.0)
Hemoglobin: 11.5 g/dL — ABNORMAL LOW (ref 12.0–15.0)
MCH: 23.4 pg — ABNORMAL LOW (ref 26.0–34.0)
MCHC: 32.4 g/dL (ref 30.0–36.0)
MCV: 72.3 fL — AB (ref 78.0–100.0)
Platelets: 245 10*3/uL (ref 150–400)
RBC: 4.91 MIL/uL (ref 3.87–5.11)
RDW: 15 % (ref 11.5–15.5)
WBC: 15.7 10*3/uL — AB (ref 4.0–10.5)

## 2014-02-06 LAB — OB RESULTS CONSOLE GBS: STREP GROUP B AG: NEGATIVE

## 2014-02-06 LAB — GROUP B STREP BY PCR: GROUP B STREP BY PCR: NEGATIVE

## 2014-02-06 MED ORDER — EPHEDRINE 5 MG/ML INJ: 10.0000 mg | INTRAVENOUS | Status: DC | PRN

## 2014-02-06 MED ORDER — FENTANYL 2.5 MCG/ML BUPIVACAINE 1/10 % EPIDURAL INFUSION (WH - ANES)
14.0000 mL/h | INTRAMUSCULAR | Status: DC | PRN
  Administered 2014-02-06 – 2014-02-07 (×2): 14 mL/h via EPIDURAL
  Filled 2014-02-06 (×2): qty 125

## 2014-02-06 MED ORDER — ACETAMINOPHEN 325 MG PO TABS: 650.0000 mg | ORAL_TABLET | ORAL | Status: DC | PRN

## 2014-02-06 MED ORDER — LACTATED RINGERS IV SOLN: 500.0000 mL | INTRAVENOUS | Status: DC | PRN

## 2014-02-06 MED ORDER — PHENYLEPHRINE 40 MCG/ML (10ML) SYRINGE FOR IV PUSH (FOR BLOOD PRESSURE SUPPORT)
80.0000 ug | PREFILLED_SYRINGE | INTRAVENOUS | Status: DC | PRN
  Filled 2014-02-06: qty 10

## 2014-02-06 MED ORDER — OXYCODONE-ACETAMINOPHEN 5-325 MG PO TABS: 1.0000 | ORAL_TABLET | ORAL | Status: DC | PRN

## 2014-02-06 MED ORDER — FLEET ENEMA 7-19 GM/118ML RE ENEM: 1.0000 | ENEMA | RECTAL | Status: DC | PRN

## 2014-02-06 MED ORDER — PHENYLEPHRINE 40 MCG/ML (10ML) SYRINGE FOR IV PUSH (FOR BLOOD PRESSURE SUPPORT): 80.0000 ug | PREFILLED_SYRINGE | INTRAVENOUS | Status: DC | PRN

## 2014-02-06 MED ORDER — DIPHENHYDRAMINE HCL 50 MG/ML IJ SOLN: 12.5000 mg | INTRAMUSCULAR | Status: DC | PRN

## 2014-02-06 MED ORDER — ONDANSETRON HCL 4 MG/2ML IJ SOLN: 4.0000 mg | Freq: Four times a day (QID) | INTRAMUSCULAR | Status: DC | PRN

## 2014-02-06 MED ORDER — LIDOCAINE HCL (PF) 1 % IJ SOLN
INTRAMUSCULAR | Status: AC
  Filled 2014-02-06: qty 30

## 2014-02-06 MED ORDER — OXYTOCIN 40 UNITS IN LACTATED RINGERS INFUSION - SIMPLE MED
62.5000 mL/h | INTRAVENOUS | Status: DC
  Filled 2014-02-06: qty 1000

## 2014-02-06 MED ORDER — IBUPROFEN 600 MG PO TABS: 600.0000 mg | ORAL_TABLET | Freq: Four times a day (QID) | ORAL | Status: DC | PRN

## 2014-02-06 MED ORDER — OXYTOCIN BOLUS FROM INFUSION: 500.0000 mL | INTRAVENOUS | Status: DC

## 2014-02-06 MED ORDER — FENTANYL CITRATE 0.05 MG/ML IJ SOLN
50.0000 ug | INTRAMUSCULAR | Status: DC | PRN
  Administered 2014-02-06: 50 ug via INTRAVENOUS
  Filled 2014-02-06: qty 2

## 2014-02-06 MED ORDER — LACTATED RINGERS IV SOLN: 500.0000 mL | Freq: Once | INTRAVENOUS | Status: DC

## 2014-02-06 MED ORDER — CITRIC ACID-SODIUM CITRATE 334-500 MG/5ML PO SOLN
30.0000 mL | ORAL | Status: DC | PRN
  Administered 2014-02-07: 30 mL via ORAL
  Filled 2014-02-06: qty 15

## 2014-02-06 MED ORDER — LACTATED RINGERS IV SOLN
INTRAVENOUS | Status: DC
  Administered 2014-02-06 – 2014-02-07 (×2): via INTRAVENOUS

## 2014-02-06 MED ORDER — LIDOCAINE HCL (PF) 1 % IJ SOLN
INTRAMUSCULAR | Status: DC | PRN
  Administered 2014-02-06 (×4): 4 mL

## 2014-02-06 MED ORDER — LIDOCAINE HCL (PF) 1 % IJ SOLN: 30.0000 mL | INTRAMUSCULAR | Status: DC | PRN

## 2014-02-06 NOTE — Anesthesia Procedure Notes (Signed)
Epidural Patient location during procedure: OB Start time: 02/06/2014 10:36 PM  Staffing Anesthesiologist: Alphia Behanna Performed by: anesthesiologist   Preanesthetic Checklist Completed: patient identified, site marked, surgical consent, pre-op evaluation, timeout performed, IV checked, risks and benefits discussed and monitors and equipment checked  Epidural Patient position: sitting Prep: site prepped and draped and DuraPrep Patient monitoring: continuous pulse ox and blood pressure Approach: midline Location: L3-L4 Injection technique: LOR air  Needle:  Needle type: Tuohy  Needle gauge: 17 G Needle length: 9 cm and 9 Needle insertion depth: 4 cm Catheter type: closed end flexible Catheter size: 19 Gauge Catheter at skin depth: 9 cm Test dose: negative  Assessment Events: blood not aspirated, injection not painful, no injection resistance, negative IV test and no paresthesia  Additional Notes Discussed risk of headache, infection, bleeding, nerve injury and failed or incomplete block.  Arabic interpreter used via [employer] line.  Patient voices understanding and wishes to proceed.  Epidural placed easily on first attempt.  No paresthesia.  Patient tolerated procedure well with no apparent complications.  Jasmine DecemberA. Matan Steen, MDReason for block:procedure for pain

## 2014-02-06 NOTE — MAU Note (Signed)
Pt states she has been  Having pain since last night. Pt states pain became stronger 1200
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
## 2014-02-06 NOTE — Progress Notes (Signed)
   [name] is a 23 y.o. G1P0 at 5433w1d  admitted for active labor  Subjective:  Comfortable with epidural  Objective: Filed Vitals:   02/06/14 2305 02/06/14 2306 02/06/14 2310 02/06/14 2311  BP:  110/77  92/60  Pulse: 145 147 162 158  Temp:      TempSrc:      Resp:  18  18  Height:      Weight:      SpO2:  100%  99%      FHT:  FHR: 150 bpm, variability: moderate,  accelerations:  Present,  decelerations:  Absent UC:   regular, every 2-3 minutes SVE:   Dilation: 9 Effacement (%): 90 Station: 0 Exam by:: f. cresenzo-dishmon, cnm  AROM with moderate mec fluid  Labs: Lab Results  Component Value Date   WBC 15.7* 02/06/2014   HGB 11.5* 02/06/2014   HCT 35.5* 02/06/2014   MCV 72.3* 02/06/2014   PLT 245 02/06/2014    Assessment / Plan: Spontaneous labor, progressing normally  Labor: Progressing normally Fetal Wellbeing:  Category I Pain Control:  Epidural Anticipated MOD:  NSVD  CRESENZO-DISHMAN,Mayda Shippee 02/06/2014, 11:13 PM

## 2014-02-07 ENCOUNTER — Encounter (HOSPITAL_COMMUNITY): Payer: Self-pay | Admitting: *Deleted

## 2014-02-07 ENCOUNTER — Encounter (HOSPITAL_COMMUNITY)
Admission: AD | Disposition: A | Discharge status: Home / Self Care | Payer: Self-pay | Source: Ambulatory Visit | Attending: Obstetrics & Gynecology

## 2014-02-07 DIAGNOSIS — O324XX0 Maternal care for high head at term, not applicable or unspecified: Secondary | ICD-10-CM

## 2014-02-07 DIAGNOSIS — O411090 Infection of amniotic sac and membranes, unspecified, unspecified trimester, not applicable or unspecified: Secondary | ICD-10-CM

## 2014-02-07 LAB — RPR

## 2014-02-07 LAB — TYPE AND SCREEN
ABO/RH(D): A POS
ANTIBODY SCREEN: NEGATIVE

## 2014-02-07 LAB — ABO/RH: ABO/RH(D): A POS

## 2014-02-07 SURGERY — Surgical Case
Anesthesia: Epidural

## 2014-02-07 MED ORDER — FENTANYL 2.5 MCG/ML BUPIVACAINE 1/10 % EPIDURAL INFUSION (WH - ANES): 10.0000 mL/h | INTRAMUSCULAR | Status: DC | PRN

## 2014-02-07 MED ORDER — NALOXONE HCL 0.4 MG/ML IJ SOLN: 0.4000 mg | INTRAMUSCULAR | Status: DC | PRN

## 2014-02-07 MED ORDER — SODIUM BICARBONATE 8.4 % IV SOLN
INTRAVENOUS | Status: AC
  Filled 2014-02-07: qty 50

## 2014-02-07 MED ORDER — ONDANSETRON HCL 4 MG/2ML IJ SOLN: 4.0000 mg | Freq: Three times a day (TID) | INTRAMUSCULAR | Status: DC | PRN

## 2014-02-07 MED ORDER — ONDANSETRON HCL 4 MG/2ML IJ SOLN
INTRAMUSCULAR | Status: DC | PRN
  Administered 2014-02-07: 4 mg via INTRAVENOUS

## 2014-02-07 MED ORDER — DIPHENHYDRAMINE HCL 25 MG PO CAPS: 25.0000 mg | ORAL_CAPSULE | ORAL | Status: DC | PRN

## 2014-02-07 MED ORDER — OXYTOCIN 10 UNIT/ML IJ SOLN
40.0000 [IU] | INTRAVENOUS | Status: DC | PRN
  Administered 2014-02-07: 40 [IU] via INTRAVENOUS

## 2014-02-07 MED ORDER — ZOLPIDEM TARTRATE 5 MG PO TABS: 5.0000 mg | ORAL_TABLET | Freq: Every evening | ORAL | Status: DC | PRN

## 2014-02-07 MED ORDER — CEFAZOLIN SODIUM-DEXTROSE 2-3 GM-% IV SOLR
INTRAVENOUS | Status: DC | PRN
  Administered 2014-02-07: 2 g via INTRAVENOUS

## 2014-02-07 MED ORDER — NALBUPHINE HCL 10 MG/ML IJ SOLN: 5.0000 mg | INTRAMUSCULAR | Status: DC | PRN

## 2014-02-07 MED ORDER — DIBUCAINE 1 % RE OINT: 1.0000 "application " | TOPICAL_OINTMENT | RECTAL | Status: DC | PRN

## 2014-02-07 MED ORDER — PHENYLEPHRINE 40 MCG/ML (10ML) SYRINGE FOR IV PUSH (FOR BLOOD PRESSURE SUPPORT)
80.0000 ug | PREFILLED_SYRINGE | INTRAVENOUS | Status: DC | PRN
  Filled 2014-02-07: qty 2

## 2014-02-07 MED ORDER — KETOROLAC TROMETHAMINE 30 MG/ML IJ SOLN: 30.0000 mg | Freq: Four times a day (QID) | INTRAMUSCULAR | Status: DC | PRN

## 2014-02-07 MED ORDER — ONDANSETRON HCL 4 MG/2ML IJ SOLN
INTRAMUSCULAR | Status: AC
  Filled 2014-02-07: qty 2

## 2014-02-07 MED ORDER — SCOPOLAMINE 1 MG/3DAYS TD PT72
MEDICATED_PATCH | TRANSDERMAL | Status: AC
  Administered 2014-02-07: 1.5 mg
  Filled 2014-02-07: qty 1

## 2014-02-07 MED ORDER — SIMETHICONE 80 MG PO CHEW: 80.0000 mg | CHEWABLE_TABLET | ORAL | Status: DC | PRN

## 2014-02-07 MED ORDER — EPHEDRINE 5 MG/ML INJ: 10.0000 mg | INTRAVENOUS | Status: DC | PRN

## 2014-02-07 MED ORDER — OXYTOCIN 10 UNIT/ML IJ SOLN
INTRAMUSCULAR | Status: AC
  Filled 2014-02-07: qty 4

## 2014-02-07 MED ORDER — SODIUM BICARBONATE 8.4 % IV SOLN
INTRAVENOUS | Status: DC | PRN
  Administered 2014-02-07 (×4): 5 mL via EPIDURAL

## 2014-02-07 MED ORDER — LACTATED RINGERS IV SOLN
INTRAVENOUS | Status: DC | PRN
  Administered 2014-02-07 (×2): via INTRAVENOUS

## 2014-02-07 MED ORDER — TERBUTALINE SULFATE 1 MG/ML IJ SOLN: 0.2500 mg | Freq: Once | INTRAMUSCULAR | Status: DC | PRN

## 2014-02-07 MED ORDER — OXYTOCIN 40 UNITS IN LACTATED RINGERS INFUSION - SIMPLE MED
1.0000 m[IU]/min | INTRAVENOUS | Status: DC
  Administered 2014-02-07: 2 m[IU]/min via INTRAVENOUS

## 2014-02-07 MED ORDER — EPHEDRINE 5 MG/ML INJ
10.0000 mg | INTRAVENOUS | Status: DC | PRN
  Filled 2014-02-07: qty 2

## 2014-02-07 MED ORDER — IBUPROFEN 600 MG PO TABS
600.0000 mg | ORAL_TABLET | Freq: Four times a day (QID) | ORAL | Status: DC
  Administered 2014-02-07 – 2014-02-09 (×7): 600 mg via ORAL
  Filled 2014-02-07 (×7): qty 1

## 2014-02-07 MED ORDER — KETOROLAC TROMETHAMINE 60 MG/2ML IM SOLN
60.0000 mg | Freq: Once | INTRAMUSCULAR | Status: DC | PRN
  Filled 2014-02-07: qty 2

## 2014-02-07 MED ORDER — LIDOCAINE-EPINEPHRINE (PF) 2 %-1:200000 IJ SOLN
INTRAMUSCULAR | Status: AC
  Filled 2014-02-07: qty 20

## 2014-02-07 MED ORDER — MEPERIDINE HCL 25 MG/ML IJ SOLN: 6.2500 mg | INTRAMUSCULAR | Status: DC | PRN

## 2014-02-07 MED ORDER — METOCLOPRAMIDE HCL 5 MG/ML IJ SOLN: 10.0000 mg | Freq: Once | INTRAMUSCULAR | Status: DC | PRN

## 2014-02-07 MED ORDER — 0.9 % SODIUM CHLORIDE (POUR BTL) OPTIME
TOPICAL | Status: DC | PRN
  Administered 2014-02-07: 500 mL

## 2014-02-07 MED ORDER — MORPHINE SULFATE 0.5 MG/ML IJ SOLN
INTRAMUSCULAR | Status: AC
  Filled 2014-02-07: qty 10

## 2014-02-07 MED ORDER — PHENYLEPHRINE 40 MCG/ML (10ML) SYRINGE FOR IV PUSH (FOR BLOOD PRESSURE SUPPORT)
PREFILLED_SYRINGE | INTRAVENOUS | Status: AC
  Filled 2014-02-07: qty 5

## 2014-02-07 MED ORDER — LACTATED RINGERS IV SOLN
INTRAVENOUS | Status: DC | PRN
  Administered 2014-02-07: 07:00:00 via INTRAVENOUS

## 2014-02-07 MED ORDER — LANOLIN HYDROUS EX OINT: 1.0000 "application " | TOPICAL_OINTMENT | CUTANEOUS | Status: DC | PRN

## 2014-02-07 MED ORDER — MENTHOL 3 MG MT LOZG: 1.0000 | LOZENGE | OROMUCOSAL | Status: DC | PRN

## 2014-02-07 MED ORDER — ONDANSETRON HCL 4 MG PO TABS: 4.0000 mg | ORAL_TABLET | ORAL | Status: DC | PRN

## 2014-02-07 MED ORDER — LACTATED RINGERS IV SOLN: 500.0000 mL | Freq: Once | INTRAVENOUS | Status: DC

## 2014-02-07 MED ORDER — PHENYLEPHRINE HCL 10 MG/ML IJ SOLN
INTRAMUSCULAR | Status: DC | PRN
  Administered 2014-02-07: 40 ug via INTRAVENOUS

## 2014-02-07 MED ORDER — METHYLERGONOVINE MALEATE 0.2 MG/ML IJ SOLN
INTRAMUSCULAR | Status: AC
  Filled 2014-02-07: qty 1

## 2014-02-07 MED ORDER — NALOXONE HCL 1 MG/ML IJ SOLN: 1.0000 ug/kg/h | INTRAVENOUS | Status: DC | PRN

## 2014-02-07 MED ORDER — DIPHENHYDRAMINE HCL 50 MG/ML IJ SOLN: 25.0000 mg | INTRAMUSCULAR | Status: DC | PRN

## 2014-02-07 MED ORDER — CLINDAMYCIN PHOSPHATE 900 MG/50ML IV SOLN: 900.0000 mg | Freq: Three times a day (TID) | INTRAVENOUS | Status: DC

## 2014-02-07 MED ORDER — PRENATAL MULTIVITAMIN CH
1.0000 | ORAL_TABLET | Freq: Every day | ORAL | Status: DC
  Administered 2014-02-08 – 2014-02-09 (×2): 1 via ORAL
  Filled 2014-02-07 (×2): qty 1

## 2014-02-07 MED ORDER — METHYLERGONOVINE MALEATE 0.2 MG/ML IJ SOLN
INTRAMUSCULAR | Status: DC | PRN
  Administered 2014-02-07: .2 mg via INTRAMUSCULAR

## 2014-02-07 MED ORDER — NALOXONE HCL 1 MG/ML IJ SOLN: 1.0000 ug/kg/h | INTRAMUSCULAR | Status: DC | PRN

## 2014-02-07 MED ORDER — DIPHENHYDRAMINE HCL 50 MG/ML IJ SOLN: 12.5000 mg | INTRAMUSCULAR | Status: DC | PRN

## 2014-02-07 MED ORDER — GENTAMICIN SULFATE 40 MG/ML IJ SOLN
Freq: Three times a day (TID) | INTRAVENOUS | Status: AC
  Administered 2014-02-07 – 2014-02-08 (×3): via INTRAVENOUS
  Filled 2014-02-07 (×3): qty 3

## 2014-02-07 MED ORDER — SODIUM CHLORIDE 0.9 % IJ SOLN: 3.0000 mL | INTRAMUSCULAR | Status: DC | PRN

## 2014-02-07 MED ORDER — MEASLES, MUMPS & RUBELLA VAC ~~LOC~~ INJ
0.5000 mL | INJECTION | Freq: Once | SUBCUTANEOUS | Status: DC
  Filled 2014-02-07: qty 0.5

## 2014-02-07 MED ORDER — MORPHINE SULFATE (PF) 0.5 MG/ML IJ SOLN
INTRAMUSCULAR | Status: DC | PRN
  Administered 2014-02-07: 4 ug via EPIDURAL
  Administered 2014-02-07: 1 ug via INTRAVENOUS

## 2014-02-07 MED ORDER — METHYLERGONOVINE MALEATE 0.2 MG/ML IJ SOLN
INTRAMUSCULAR | Status: DC | PRN
  Administered 2014-02-07: 0.2 mg via INTRAMUSCULAR

## 2014-02-07 MED ORDER — FENTANYL CITRATE 0.05 MG/ML IJ SOLN: 25.0000 ug | INTRAMUSCULAR | Status: DC | PRN

## 2014-02-07 MED ORDER — DIPHENHYDRAMINE HCL 25 MG PO CAPS: 25.0000 mg | ORAL_CAPSULE | Freq: Four times a day (QID) | ORAL | Status: DC | PRN

## 2014-02-07 MED ORDER — METOCLOPRAMIDE HCL 5 MG/ML IJ SOLN: 10.0000 mg | Freq: Three times a day (TID) | INTRAMUSCULAR | Status: DC | PRN

## 2014-02-07 MED ORDER — TETANUS-DIPHTH-ACELL PERTUSSIS 5-2.5-18.5 LF-MCG/0.5 IM SUSP: 0.5000 mL | Freq: Once | INTRAMUSCULAR | Status: DC

## 2014-02-07 MED ORDER — OXYCODONE-ACETAMINOPHEN 5-325 MG PO TABS
1.0000 | ORAL_TABLET | ORAL | Status: DC | PRN
  Administered 2014-02-08 – 2014-02-09 (×5): 1 via ORAL
  Filled 2014-02-07 (×5): qty 1

## 2014-02-07 MED ORDER — MEPERIDINE HCL 25 MG/ML IJ SOLN
INTRAMUSCULAR | Status: DC | PRN
  Administered 2014-02-07: 25 mg via INTRAVENOUS

## 2014-02-07 MED ORDER — SIMETHICONE 80 MG PO CHEW
80.0000 mg | CHEWABLE_TABLET | ORAL | Status: DC
  Administered 2014-02-08 – 2014-02-09 (×2): 80 mg via ORAL
  Filled 2014-02-07 (×2): qty 1

## 2014-02-07 MED ORDER — WITCH HAZEL-GLYCERIN EX PADS: 1.0000 "application " | MEDICATED_PAD | CUTANEOUS | Status: DC | PRN

## 2014-02-07 MED ORDER — NALBUPHINE HCL 10 MG/ML IJ SOLN
5.0000 mg | INTRAMUSCULAR | Status: DC | PRN
  Administered 2014-02-07: 10 mg via INTRAVENOUS
  Filled 2014-02-07: qty 1

## 2014-02-07 MED ORDER — PHENYLEPHRINE 40 MCG/ML (10ML) SYRINGE FOR IV PUSH (FOR BLOOD PRESSURE SUPPORT): 80.0000 ug | PREFILLED_SYRINGE | INTRAVENOUS | Status: DC | PRN

## 2014-02-07 MED ORDER — ONDANSETRON HCL 4 MG/2ML IJ SOLN: 4.0000 mg | INTRAMUSCULAR | Status: DC | PRN

## 2014-02-07 MED ORDER — KETOROLAC TROMETHAMINE 60 MG/2ML IM SOLN
INTRAMUSCULAR | Status: AC
  Administered 2014-02-07: 60 mg
  Filled 2014-02-07: qty 2

## 2014-02-07 MED ORDER — SCOPOLAMINE 1 MG/3DAYS TD PT72: 1.0000 | MEDICATED_PATCH | Freq: Once | TRANSDERMAL | Status: DC

## 2014-02-07 MED ORDER — OXYTOCIN 40 UNITS IN LACTATED RINGERS INFUSION - SIMPLE MED: 62.5000 mL/h | INTRAVENOUS | Status: AC

## 2014-02-07 MED ORDER — SENNOSIDES-DOCUSATE SODIUM 8.6-50 MG PO TABS
2.0000 | ORAL_TABLET | ORAL | Status: DC
  Administered 2014-02-08 – 2014-02-09 (×2): 2 via ORAL
  Filled 2014-02-07 (×2): qty 2

## 2014-02-07 MED ORDER — LACTATED RINGERS IV SOLN: INTRAVENOUS | Status: DC

## 2014-02-07 SURGICAL SUPPLY — 31 items
CLAMP CORD UMBIL (MISCELLANEOUS) IMPLANT
CLOSURE WOUND 1/2 X4 (GAUZE/BANDAGES/DRESSINGS) ×1
CLOTH BEACON ORANGE TIMEOUT ST (SAFETY) ×3 IMPLANT
DRAIN JACKSON PRT FLT 7MM (DRAIN) IMPLANT
DRAPE LG THREE QUARTER DISP (DRAPES) IMPLANT
DRSG OPSITE POSTOP 4X10 (GAUZE/BANDAGES/DRESSINGS) ×3 IMPLANT
DURAPREP 26ML APPLICATOR (WOUND CARE) ×3 IMPLANT
ELECT REM PT RETURN 9FT ADLT (ELECTROSURGICAL) ×3
ELECTRODE REM PT RTRN 9FT ADLT (ELECTROSURGICAL) ×1 IMPLANT
EVACUATOR SILICONE 100CC (DRAIN) IMPLANT
EXTRACTOR VACUUM M CUP 4 TUBE (SUCTIONS) IMPLANT
EXTRACTOR VACUUM M CUP 4' TUBE (SUCTIONS)
GLOVE BIO SURGEON STRL SZ7 (GLOVE) ×3 IMPLANT
GLOVE BIOGEL PI IND STRL 7.0 (GLOVE) ×1 IMPLANT
GLOVE BIOGEL PI INDICATOR 7.0 (GLOVE) ×2
GOWN STRL REUS W/TWL LRG LVL3 (GOWN DISPOSABLE) ×6 IMPLANT
KIT ABG SYR 3ML LUER SLIP (SYRINGE) IMPLANT
NEEDLE HYPO 25X5/8 SAFETYGLIDE (NEEDLE) ×3 IMPLANT
NS IRRIG 1000ML POUR BTL (IV SOLUTION) ×3 IMPLANT
PACK C SECTION WH (CUSTOM PROCEDURE TRAY) ×3 IMPLANT
PAD OB MATERNITY 4.3X12.25 (PERSONAL CARE ITEMS) ×3 IMPLANT
RTRCTR C-SECT PINK 25CM LRG (MISCELLANEOUS) ×3 IMPLANT
SPONGE GAUZE 4X4 12PLY STER LF (GAUZE/BANDAGES/DRESSINGS) ×3 IMPLANT
STRIP CLOSURE SKIN 1/2X4 (GAUZE/BANDAGES/DRESSINGS) ×2 IMPLANT
SUT VIC AB 0 CT1 36 (SUTURE) ×3 IMPLANT
SUT VIC AB 0 CTX 36 (SUTURE) ×10
SUT VIC AB 0 CTX36XBRD ANBCTRL (SUTURE) ×5 IMPLANT
SUT VIC AB 4-0 KS 27 (SUTURE) ×6 IMPLANT
TOWEL OR 17X24 6PK STRL BLUE (TOWEL DISPOSABLE) ×3 IMPLANT
TRAY FOLEY CATH 14FR (SET/KITS/TRAYS/PACK) ×3 IMPLANT
WATER STERILE IRR 1000ML POUR (IV SOLUTION) IMPLANT

## 2014-02-07 NOTE — Progress Notes (Signed)
Patient's aunt in room and explained that patient's full name is [name].  This would be the reason there are two charts.  The first time she came to MAU, she only gave [name] as her name.

## 2014-02-07 NOTE — Addendum Note (Signed)
Addendum created 02/07/14 1840 by Tyrone AppleMichael A. Malen GauzeFoster, MD   Modules edited: Orders, PRL Based Order Sets

## 2014-02-07 NOTE — Anesthesia Postprocedure Evaluation (Signed)
  Anesthesia Post-op Note  Patient: Mary SangerFatema S Love  Procedure(s) Performed: Procedure(s): CESAREAN SECTION (N/A)  Patient is awake, responsive, moving her legs, and has signs of resolution of her numbness. Pain and nausea are reasonably well controlled. Vital signs are stable and clinically acceptable. Oxygen saturation is clinically acceptable. There are no apparent anesthetic complications at this time. Patient is ready for discharge.

## 2014-02-07 NOTE — Anesthesia Postprocedure Evaluation (Signed)
Anesthesia Post Note  Patient: [name]  Procedure(s) Performed: Procedure(s) (LRB): CESAREAN SECTION (N/A)  Anesthesia type: Epidural  Patient location: Mother/Baby  Post pain: Pain level controlled  Post assessment: Post-op Vital signs reviewed  Last Vitals:  Filed Vitals:   02/07/14 0929  BP: 124/71  Pulse: 101  Temp: 37.1 C  Resp: 16    Post vital signs: Reviewed  Level of consciousness:alert  Complications: No apparent anesthesia complications

## 2014-02-07 NOTE — Progress Notes (Signed)
Patient sitting in recliner eating dinner.  Smiling, reports no pain.  No questions at this time.

## 2014-02-07 NOTE — Progress Notes (Signed)
CSW received message from "Dottie" (CSW could not understand what she stated her last name to be), who said she was "assisting MOB with the adoption."  CSW attempted to contact this person on 3 separate occasions today without reaching her.  The message stated that the voicemail box had not been set up.  Dottie can be reached at [phone].  CSW is familiar with this patient from MAU admission on 10/25/13 under the name "[name]."  CSW left a detailed report for weekend CSW. 

## 2014-02-07 NOTE — Addendum Note (Signed)
Addendum created 02/07/14 1343 by Algis GreenhouseLinda A Deetra Booton, CRNA   Modules edited: Notes Section   Notes Section:  File: [id number]

## 2014-02-07 NOTE — Progress Notes (Signed)
[employer] interpreter (arabic) used to obtain consent for C.section with Dr Penne LashLeggett. Risks & benefits of surgery reviewed with patient & family. Questions were answered. Patient verbalized understanding.

## 2014-02-07 NOTE — Progress Notes (Signed)
UR chart review completed.  

## 2014-02-07 NOTE — Progress Notes (Signed)
Aunt called to the nurses station and asked for someone to come in and help with the baby.  Upon entering room, Aunt was seated on couch, patient asleep in bed, and the baby was fussy laying in the bassinet closer to the door.  Aunt stated, "the baby is crying and I don't know what to do".  I checked the baby's diaper and fed the baby a bottle.  The other Aunt, who is adopting the baby, came into the room and finished the feeding.

## 2014-02-07 NOTE — Transfer of Care (Signed)
Immediate Anesthesia Transfer of Care Note  Patient: Mary SangerFatema S Love  Procedure(s) Performed: Procedure(s): CESAREAN SECTION (N/A)  Patient Location: PACU  Anesthesia Type:Epidural  Level of Consciousness: awake and sedated  Airway & Oxygen Therapy: Patient Spontanous Breathing  Post-op Assessment: Report given to PACU RN  Post vital signs: Reviewed and stable  Complications: No apparent anesthesia complications

## 2014-02-07 NOTE — Progress Notes (Signed)
Patient admitted to Mother Baby unit.  Report given from PACU RN, stated that mother told her that anyone could come to visit.  I questioned the Aunt who was present with the baby who told me she was adopting the baby, and she also stated that anyone could come and visit.  This Aunt is listed as the patient's interpreter, form is in the chart, but after reading history of this patient, I felt [employer] needed to be called to verify.  Through the [employer] interpreter, the patient stated that only the two Aunts and her friend, "Selena BattenKim" who walked into the room, were the only ones that could visit.  She stated not her father.

## 2014-02-07 NOTE — Progress Notes (Signed)
   [name] is a 23 y.o. G1P0 at 8557w2d  admitted for active labor  Subjective: Comfortable with epidural  Objective: Filed Vitals:   02/07/14 0031 02/07/14 0101 02/07/14 0105 02/07/14 0131  BP: 118/82 130/81  103/62  Pulse: 121 118  112  Temp:   98.5 F (36.9 C)   TempSrc:   Oral   Resp: 18 18  18   Height:      Weight:      SpO2:          FHT:  FHR: 150 bpm, variability: moderate,  accelerations:  Present,  decelerations:  Absent UC:   IUPC placed.  MVU's ~ 180-27100mmHg, 3-4 minutes apart SVE:   Dilation: 7 Effacement (%): 80 Station: 0 Exam by:: e. poore, rn  My exam differs from the RN 9/90/0, but we both agree that there has not been much change besides descent in a few hours.  Feels OP  Labs: Lab Results  Component Value Date   WBC 15.7* 02/06/2014   HGB 11.5* 02/06/2014   HCT 35.5* 02/06/2014   MCV 72.3* 02/06/2014   PLT 245 02/06/2014    Assessment / Plan: Protracted active phase Will augment with pitocin and try position changes Labor: slow active phase Fetal Wellbeing:  Category I Pain Control:  Epidural Anticipated MOD:  NSVD  CRESENZO-DISHMAN,Jamiah Homeyer 02/07/2014, 2:08 AM

## 2014-02-07 NOTE — Op Note (Signed)
[name] PROCEDURE DATE: 02/06/2014 - 02/07/2014  PREOPERATIVE DIAGNOSIS: Intrauterine pregnancy at  5545w2d weeks gestation with failed vacuum.  POSTOPERATIVE DIAGNOSIS: The same  PROCEDURE:    Low Transverse Cesarean Section  SURGEON:  Dr. Elsie LincolnKelly Arionna Hoggard  ASSISTANT: Dr. Rulon AbideKeli Beck  INDICATIONS: Mary SangerFatema S Love is a 23 y.o. G1P0 at 1445w2d with failed vacuum and arrest of descent.  The risks of cesarean section discussed with the patient included but were not limited to: bleeding which may require transfusion or reoperation; infection which may require antibiotics; injury to bowel, bladder, ureters or other surrounding organs; injury to the fetus; need for additional procedures including hysterectomy in the event of a life-threatening hemorrhage; placental abnormalities wth subsequent pregnancies, incisional problems, thromboembolic phenomenon and other postoperative/anesthesia complications. The patient concurred with the proposed plan, giving informed written consent for the procedure.  Used arabic phone interpreter for consent.  FINDINGS:  Viable female  infant in cephalic presentation, 9,9 Apgars, weight to be determined in 1 hour, thick meconium amniotic fluid.  Intact placenta, three vessel cord.  Grossly normal uterus, ovaries and fallopian tubes. .   ANESTHESIA:    Epidural   ESTIMATED BLOOD LOSS: 800cc  SPECIMENS: Placenta sent to pathology  COMPLICATIONS: None immediate  PROCEDURE IN DETAIL:  The patient received intravenous antibiotics and had sequential compression devices applied to her lower extremities.  Epidural anesthesia was dosed up to surgical level and was found to be adequate. She was then placed in a dorsal supine position with a leftward tilt, and prepped and draped in a sterile manner.  A foley catheter was placed into her bladder and attached to constant gravity.  After an adequate timeout was performed, a Pfannenstiel skin incision was made with scalpel and carried  through to the underlying layer of fascia. The fascia was incised in the midline and this incision was extended bilaterally using the Mayo scissors. Kocher clamps were applied to the superior aspect of the fascial incision and the underlying rectus muscles were dissected off bluntly. A similar process was carried out on the inferior aspect of the facial incision. The rectus muscles were separated in the midline bluntly and the peritoneum was entered bluntly.   A transverse hysterotomy was made with a scalpel and extended bilaterally bluntly. The bladder blade was then removed. The infant was successfully delivered, and cord was clamped and cut and infant was handed over to awaiting neonatology team. Uterine massage was then administered and the placenta delivered intact with three-vessel cord. The uterus was cleared of clot and debris.  The hysterotomy was closed with 0 vicryl.  A second imbricating suture of 0-Vicryl was used to reinforce the incision and aid in hemostasis.  The peritoneum and rectus muscles were noted to be hemostatic.  There was mild uterine atony and methergine x1 dose given.  The fascia was closed with 0-Vicryl in a running fashion with good restoration of anatomy.  The subcutaneus tissue was copiously irrigated.  The skin was closed with 4-0 Vicryl in a subcuticular fashion.    Pt tolerated the procedure will.  All counts were correct x2.  Pt went to the recovery room in stable condition.

## 2014-02-07 NOTE — Progress Notes (Signed)
ANTIBIOTIC CONSULT NOTE - INITIAL  Pharmacy Consult for Gentamicin Indication: Chorioamnionitis   Allergies  Allergen Reactions  . Eggs Or Egg-Derived Products Hives and Itching  . Penicillins Hives and Itching    Patient Measurements: Height: 5\' 1"  (154.9 cm) Weight: 115 lb (52.164 kg) IBW/kg (Calculated) : 47.8 kg Dosing weight: 52.2 kg  Vital Signs: Temp: 98.8 F (37.1 C) (07/17 0929) Temp src: Oral (07/17 0650) BP: 124/71 mmHg (07/17 0929) Pulse Rate: 101 (07/17 0929)  Labs:  Recent Labs  02/06/14 2129  WBC 15.7*  HGB 11.5*  PLT 245     Microbiology: Recent Results (from the past 720 hour(s))  OB RESULTS CONSOLE GBS     Status: None   Collection Time    02/06/14 12:00 AM      Result Value Ref Range Status   GBS Negative   Final  GROUP B STREP BY PCR     Status: None   Collection Time    02/06/14  9:20 PM      Result Value Ref Range Status   Group B strep by PCR NEGATIVE  NEGATIVE Final    Medications:  Clindamycin 900 mg IV Q8H  Assessment: 23 y.o. female G1P1001 at 8076w2d s/p C-section with suspected chorio Estimated Ke = 0.398, Vd = 0.35 L/kg  Goal of Therapy:  Gentamicin peak 6-8 mg/L and Trough < 1 mg/L  Plan:  Gentamicin 120 mg IV every 8 hrs  Will check gentamicin levels if continued > 72hr or clinically indicated.  Aries Townley P 02/07/2014,10:32 AM
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
## 2014-02-08 LAB — CBC
HCT: 27.7 % — ABNORMAL LOW (ref 36.0–46.0)
HEMOGLOBIN: 8.9 g/dL — AB (ref 12.0–15.0)
MCH: 23.5 pg — AB (ref 26.0–34.0)
MCHC: 31.8 g/dL (ref 30.0–36.0)
MCV: 73.9 fL — ABNORMAL LOW (ref 78.0–100.0)
Platelets: 216 10*3/uL (ref 150–400)
RBC: 3.75 MIL/uL — ABNORMAL LOW (ref 3.87–5.11)
RDW: 15.4 % (ref 11.5–15.5)
WBC: 18.8 10*3/uL — ABNORMAL HIGH (ref 4.0–10.5)

## 2014-02-08 MED ORDER — FERROUS SULFATE 325 (65 FE) MG PO TABS
325.0000 mg | ORAL_TABLET | Freq: Two times a day (BID) | ORAL | Status: DC
  Administered 2014-02-09: 325 mg via ORAL
  Filled 2014-02-08: qty 1

## 2014-02-08 NOTE — Progress Notes (Addendum)
Post Partum Day 1, POD #1 Subjective: no complaints, up ad lib, voiding and tolerating PO, small lochia, plans to bottle feed, unsure, denies passing gas or bowel movement  Objective: Blood pressure 100/68, pulse 90, temperature 97.8 F (36.6 C), temperature source Oral, resp. rate 16, height 5\' 1"  (1.549 m), weight 52.164 kg (115 lb), last menstrual period 06/08/2013, SpO2 98.00%, unknown if currently breastfeeding.  Physical Exam:  General: alert, cooperative and no distress Lochia:normal flow Chest: CTAB Heart: RRR no m/r/g Abdomen: +BS, soft, nontender,  Uterine Fundus: firm, incision with honeycomb dressing appearing clean/dry DVT Evaluation: No evidence of DVT seen on physical exam. Extremities: no edema   Recent Labs  02/06/14 2129 02/08/14 0630  HGB 11.5* 8.9*  HCT 35.5* 27.7*    Assessment/Plan: G1P1 at 6423w2d 23 yo female s/p pLTC POD#1 Plan for discharge tomorrow Ferrous sulfate BID Discuss LARC more in detail with translator Advance diet as tolerated   LOS: 2 days   Fredirick LatheKristy Acosta 02/08/2014, 12:27 PM

## 2014-02-08 NOTE — Progress Notes (Signed)
Patient ID: [name], female   DOB: 05/25/1991, 23 y.o.   MRN: [id number]   Note written on July 17th was written in wrong chart Genella MechYvonne Marvles).  Note copied and now in Ms. Balderrama's chart.  Author: Lesly DukesKelly H Lejon Afzal, MD Service: Obstetrics/Gynecology Author Type: Physician   Filed: 02/07/2014 9:46 AM Note Time: 02/07/2014 9:39 AM Status: Signed   Editor: Lesly DukesKelly H Fionn Stracke, MD (Physician)      Patient ID: [name], female DOB: 02/08/1979, 23 y.o. MRN: [id number]  Pt pushing for 1 and 1/2 h ours in ROT position. Vertex has not moved in station. There is no 1-2 cm of caput. Pt not pushing well in spite of coaching at bedside. Manual rotation Tried without success. Pt offered vacuum attempt or primary c/s. Pt understands risks include but not limited to vaginal laceration, scalp laceration, and deep intracranial bleed.  Pt's bladder empty and Kiwi vacuum applied. 3 pop offs occurred over 4 contractions. There was some descent but still unable to deliver. At this point it was decided to proceed with c/s.  The risks of cesarean section discussed with the patient included but were not limited to: bleeding which may require transfusion or reoperation; infection which may require antibiotics; injury to bowel, bladder, ureters or other surrounding organs; injury to the fetus; need for additional procedures including hysterectomy in the event of a life-threatening hemorrhage; placental abnormalities wth subsequent pregnancies, incisional problems, thromboembolic phenomenon and other postoperative/anesthesia complications. The patient concurred with the proposed plan, giving informed written consent for the procedure. This consent was obtained with [facility] interpreter.

## 2014-02-08 NOTE — Progress Notes (Signed)
Clinical Social Work Department PSYCHOSOCIAL ASSESSMENT - MATERNAL/CHILD 02/08/2014  Patient:  [name],[name]  Account Number:  [id number]  Admit Date:  02/06/2014  [name]:   [name]    Clinical Social Worker:  Greogory Cornette, LCSW   Date/Time:  02/08/2014 02:00 PM  Date Referred:  02/07/2014      Referred reason  Psychosocial assessment   Other referral source:    I:  FAMILY / HOME ENVIRONMENT Child'Love legal guardian:  PARENT  Guardian - Love Guardian - Age Guardian - Address  [name],[name] 23 [address]  [place], [place] [address]   Other household support members/support persons Other support:   [name], aunt    II  PSYCHOSOCIAL DATA Information Source:    Event organiserinancial and Community Resources Employment:   Surveyor, quantityinancial resources:  [employer] If [employer] [place]:   Other  WIC   School / Grade:   Maternity Care Coordinator / Child Services Coordination / Early Interventions:  Cultural issues impacting care:    III  STRENGTHS Strengths  Supportive family/friends  Home prepared for Child (including basic supplies)  Adequate Resources   Strength comment:    IV  RISK FACTORS AND CURRENT PROBLEMS Current Problem:       V  SOCIAL WORK ASSESSMENT Acknowledged order for social work consult.  Patient known to social work from previous hospitalization.  She is a single parent with no other dependence.  Mother is from [place].  Interviewed her using Engineer, civil (consulting)acificia Language line (Interpreter: [name] ID# [id number]).  Maternal aunt [name] was also present.  During visit with social worker in April, patient noted that she wanted to place newborn for adoption with a non relative.  However, she is now stating that the child will be placed with Maternal aunt [name].  Patient also resides with this aunt.  Patient and aunt state that they have hired an Pensions consultantAttorney, Cindee LameDottie Newell to facilitate the adoption.  CSW have not been able to reach the [place].  Mother and aunt informed  that if the adoption paperwork is not completed during hospitalization, then the child will be discharged to mother and the family can follow up with the Attorney in the community.  They were both in agreement.  When mother was asked about FOB, aunt'Love pleasant demeanor changed and she replied "she knows nothing about FOB".  Mother never responded to the question.   Aunt seems very excited about newborn.  She has no other children.  Patient denies any hx of substance abuse.  Family informed of CSW availability.      VI SOCIAL WORK PLAN Social Work Plan  No Further Intervention Required / No Barriers to Discharge

## 2014-02-09 MED ORDER — OXYCODONE-ACETAMINOPHEN 5-325 MG PO TABS: 1.0000 | ORAL_TABLET | ORAL | Status: DC | PRN

## 2014-02-09 MED ORDER — IBUPROFEN 600 MG PO TABS: 600.0000 mg | ORAL_TABLET | Freq: Four times a day (QID) | ORAL | Status: DC

## 2014-02-09 NOTE — Progress Notes (Signed)
Subjective: Postpartum Day 2: Cesarean Delivery Patient reports tolerating PO, + flatus, + BM and no problems voiding.  Pain well controlled. She has no other complaints.   Objective: Vital signs in last 24 hours: Temp:  [97.8 F (36.6 C)-98.1 F (36.7 C)] 98.1 F (36.7 C) (07/19 0656) Pulse Rate:  [68-89] 89 (07/19 0656) Resp:  [18] 18 (07/19 0656) BP: (95-98)/(50-53) 95/53 mmHg (07/19 0656)  Physical Exam:  General: alert, cooperative and no distress Lochia: appropriate Uterine Fundus: firm Incision: healing well, no significant drainage, no dehiscence, no significant erythema, slight clear drainage present DVT Evaluation: No evidence of DVT seen on physical exam. No cords or calf tenderness.   Recent Labs  02/06/14 2129 02/08/14 0630  HGB 11.5* 8.9*  HCT 35.5* 27.7*    Assessment/Plan: Status post Cesarean section. Doing well postoperatively.  Continue current care.  [place]an[place], Caleb G 02/09/2014, 7:27 AM  Please see d/c summary  Adonte Vanriper L, MD

## 2014-02-09 NOTE — Discharge Instructions (Signed)
Antes de que el beb llegue a casa (Before [employer]ardBaby Comes Home) Estas son algunas cosas que usted debe tener antes que su beb llegue a casa. Pregunte nuevamente si no comprende. Pregunte cundo debe ver al mdico nuevamente.  Asiento infantil para el auto (exigido por ley).  Camas separadas.  Si no tiene una cama para el beb o [place] cuna, puede hacerla con un cajn de Neomia Dearuna cmoda, una caja, cartn, cesta para la ropa, etc.  No deje que el beb duerma en una cama con usted u otra persona. Insumos para la alimentacin infantil:  6 a 8 botellas (8 onzas).  6 a 8 tetinas.  Jarra medidora.  Cuchara medidora.  Cepillo para limpiar botellas.  Corporate treasurersterilizador (o use cualquier olla o sartn grande con tapa).  Leche maternizada que contiene hierro.  Elementos para hervir y Financial traderenfriar el agua. Insumos de Tour managerlactancia materna.  Sacaleche.  Crema para los pezones. Ropa:  24 a 36 paales y cubrepaales de plstico y Neomia Dearuna caja de paales desechables. Usted puede necesitar tanto como 10 a 12 paales por Futures traderda.  3 camisas (otras prendas depender de la poca del ao y el clima).  3 mantillas.  3 pijamas o camisn de beb .  3 baberos. Equipamiento de bao:  Jabn suave.  Vaselina. No use aceite o talco para el beb .  Toalla de tela suave y pao para lavarlo.  Pompones de algodn.  Fuente de bao especial para el beb. Slo bae al beb con Neomia Dearuna esponja hasta que el cordn umbilical y la circuncisin hayan curado. Otros suministros:  Un termmetro y [place] pera de goma que sern entregados para que lleve a su casa cuando el beb salga del hospital. Pregunte al mdico cmo debe tomar la temperatura del beb.  1  2 chupetes. Preprese para una emergencia:   Sepa cmo llegar al hospital y sepa en qu lugar admiten al beb.  Rena todos los nmeros telefnicos de los proveedores cerca del telfono de la casa y en su celular, si tiene [place]. Prepare a su familia:   Hable con sus hijos acerca  del nuevo beb y pregunte qu piensan al respecto.  Decida cmo desea manejar las visitas y a los otros miembros de la familia.  Acepte ofertas de ayuda con el beb. Necesitar tiempo para adaptarse. Sepa cundo debe llamar al mdico:  SOLICITE AYUDA DE INMEDIATO SI:   Presenta una temperatura mayor a 100.4 F (38 C).  Abultamiento de las fontanelas en la cabeza.  Sntomas de deshidratacin como llanto sin lgrimas o ausencia de paales mojados luego de 6 horas.  Respiracin rpida.  Disminuye el estado de [place]. Document Released: 10/07/2008 Document Revised: 10/03/2011 [facility] Patient Information 2015 [place], [place]. This information is not intended to replace advice given to you by your health care provider. Make sure you discuss any questions you have with your health care provider.  Breast Pumping Tips If you are breastfeeding, there may be times when you cannot feed your baby directly. Returning to work or going on a trip are examples. Pumping allows you to store breast milk and feed it to your baby later.  You may not get much milk when you first start to pump. Your breasts should start to make more after a few days. If you pump at the times you usually feed your baby, you may be able to keep making enough milk to feed your baby without also using formula. The more often you pump, the more milk your body  will make. WHEN SHOULD I PUMP?   You can start to pump soon after you have your baby. Ask your doctor what is right for you and your baby.  If you are going back to work, start pumping a few weeks before. This gives you time to learn how to pump and to store a supply of milk.  When you are with your baby, feed your baby when he or she is hungry. Pump after each feeding.  When you are away from your baby for many hours, pump for about 15 minutes every 2-3 hours. Pump both breasts at the same time if you can.  If your baby has a formula feeding, make sure to pump close to  the same time.  If you drink any alcohol, wait 2 hours before pumping. HOW DO I GET READY TO PUMP? Your let-down reflex is your body's natural reaction that makes your breast milk flow. It is easier to make your breast milk flow when you are relaxed. Try these things to help you relax:  Smell one of your baby's blankets or an item of clothing.  Look at a picture or video of your baby.  Sit in a quiet, private space.  Massage the breast you plan to pump.  Place soothing warmth on the breast.  Play relaxing music. WHAT ARE SOME BREAST PUMPING TIPS?  Wash your hands before you pump. You do not need to wash your nipples or breasts.  There are three ways to pump. You can:  Use your hand to massage and squeeze your breast.  Use a handheld manual pump.  Use an electric pump.  Make sure the suction cup on the breast pump is the right size. Place the suction cup directly over the nipple. It can be painful or hurt your nipple if it is the wrong size or placed wrong.  Put a small amount of purified or modified lanolin on your nipple and areola if you are sore.  If you are using an electric pump, change the speed and suction power to be more comfortable.  You may need a different type of pump if pumping hurts or you do not get a lot of milk. Your doctor can help you pick what type of pump to use.  Keep a full water bottle near you always. Drinking lots of fluid helps you make more milk.  You can store your milk to use later. Pumped breast milk can be stored in a sealable, sterile container or plastic bag. Always put the date you pumped it on the container.  Milk can stay out at room temperature for up to 8 hours.  You can store your milk in the refrigerator for up to 8 days.  You can store your milk in the freezer for 3 months. Thaw frozen milk using warm water. Do not put it in the microwave.  Do not smoke. Ask your doctor for help. WHEN SHOULD I CALL MY DOCTOR?  You have a  hard time pumping.  You are worried you do not make enough milk.  You have nipple pain, soreness, or redness.  You want to take birth control pills. Document Released: 12/28/2007 Document Revised: 07/16/2013 Document Reviewed: 05/03/2013 [facility] Patient Information 2015 [place], [place]. This information is not intended to replace advice given to you by your health care provider. Make sure you discuss any questions you have with your health care provider.  Before [facility] Ask any questions about feeding, diapering, and baby care before  you leave the hospital. Ask again if you do not understand. Ask when you need to see the doctor again. There are several things you must have before your baby comes home.  Infant car seat.  Crib.  Do not let your baby sleep in a bed with you or anyone else.  If you do not have a bed for your baby, ask the doctor what you can use that will be safe for the baby to sleep in. Infant feeding supplies:  6 to 8 bottles (8 oz. size).  6 to 8 nipples.  Measuring cup.  Measuring tablespoon.  Bottle brush.  Sterilizer (or use any large pan or kettle with a lid).  Formula that contains iron.  A way to boil and cool water. Breastfeeding supplies:  Breast pump.  Nipple cream. Clothing:  24 to 36 cloth diapers and waterproof diaper covers or a box of disposable diapers. You may need as many as 10 to 12 diapers per day.  3 onesies (other clothing will depend on the time of year and the weather).  3 receiving blankets.  3 baby pajamas or gowns.  3 bibs. Bath equipment:  Mild soap.  Petroleum jelly. No baby oil or powder.  Soft cloth towel and wash cloth.  Cotton balls.  Separate bath basin for baby. Only sponge bathe until umbilical cord and circumcision are healed. Other supplies:  Thermometer and bulb syringe (ask the hospital to send them home with you). Ask your doctor about how you should take your baby's  temperature.  One to two pacifiers. Prepare for an emergency:  Know how to get to the hospital and know where to admit your baby.  Put all doctor numbers near your house phone and in your cell phone if you have one. Prepare your family:  Talk with siblings about the baby coming home and how they feel about it.  Decide how you want to handle visitors and other family members.  Take offers for help with the baby. You will need time to adjust. Know when to call the doctor.  GET HELP RIGHT AWAY IF:  Your baby's temperature is greater than 100.4 F (38 C).  The softspot on your baby's head starts to bulge.  Your baby is crying with no tears or has no wet diapers for 6 hours.  Your baby has rapid breathing.  Your baby is not as alert. Document Released: 06/23/2008 Document Revised: 10/03/2011 Document Reviewed: 09/30/2010 [facility] Patient Information 2015 [place], [place]. This information is not intended to replace advice given to you by your health care provider. Make sure you discuss any questions you have with your health care provider. Cesarean Delivery Care After Refer to this sheet in the next few weeks. These instructions provide you with information on caring for yourself after your procedure. Your health care provider may also give you specific instructions. Your treatment has been planned according to current medical practices, but problems sometimes occur. Call your health care provider if you have any problems or questions after you go home. HOME CARE INSTRUCTIONS  Only take over-the-counter or prescription medications as directed by your health care provider.  Do not drink alcohol, especially if you are breastfeeding or taking medication to relieve pain.  Do not chew or smoke tobacco.  Continue to use good perineal care. Good perineal care includes:  Wiping your perineum from front to back.  Keeping your perineum clean.  Check your surgical cut (incision) daily  for increased redness, drainage, swelling, or separation  of skin.  Clean your incision gently with soap and water every day, and then pat it dry. If your health care provider says it is OK, leave the incision uncovered. Use a bandage (dressing) if the incision is draining fluid or appears irritated. If the adhesive strips across the incision do not fall off within 7 days, carefully peel them off.  Hug a pillow when coughing or sneezing until your incision is healed. This helps to relieve pain.  Do not use tampons or douche until your health care provider says it is okay.  Shower, wash your hair, and take tub baths as directed by your health care provider.  Wear a well-fitting bra that provides breast support.  Limit wearing support panties or control-top hose.  Drink enough fluids to keep your urine clear or pale yellow.  Eat high-fiber foods such as whole grain cereals and breads, brown rice, beans, and fresh fruits and vegetables every day. These foods may help prevent or relieve constipation.  Resume activities such as climbing stairs, driving, lifting, exercising, or traveling as directed by your health care provider.  Talk to your health care provider about resuming sexual activities. This is dependent upon your risk of infection, your rate of healing, and your comfort and desire to resume sexual activity.  Try to have someone help you with your household activities and your newborn for at least a few days after you leave the hospital.  Rest as much as possible. Try to rest or take a nap when your newborn is sleeping.  Increase your activities gradually.  Keep all of your scheduled postpartum appointments. It is very important to keep your scheduled follow-up appointments. At these appointments, your health care provider will be checking to make sure that you are healing physically and emotionally. SEEK MEDICAL CARE IF:   You are passing large clots from your vagina. Save any  clots to show your health care provider.  You have a foul smelling discharge from your vagina.  You have trouble urinating.  You are urinating frequently.  You have pain when you urinate.  You have a change in your bowel movements.  You have increasing redness, pain, or swelling near your incision.  You have pus draining from your incision.  Your incision is separating.  You have painful, hard, or reddened breasts.  You have a severe headache.  You have blurred vision or see spots.  You feel sad or depressed.  You have thoughts of hurting yourself or your newborn.  You have questions about your care, the care of your newborn, or medications.  You are dizzy or lightheaded.  You have a rash.  You have pain, redness, or swelling at the site of the removed intravenous access (IV) tube.  You have nausea or vomiting.  You stopped breastfeeding and have not had a menstrual period within 12 weeks of stopping.  You are not breastfeeding and have not had a menstrual period within 12 weeks of delivery.  You have a fever. SEEK IMMEDIATE MEDICAL CARE IF:  You have persistent pain.  You have chest pain.  You have shortness of breath.  You faint.  You have leg pain.  You have stomach pain.  Your vaginal bleeding saturates 2 or more sanitary pads in 1 hour. MAKE SURE YOU:   Understand these instructions.  Will watch your condition.  Will get help right away if you are not doing well or get worse. Document Released: 04/02/2002 Document Revised: 03/13/2013 Document Reviewed: 03/07/2012 ExitCare  Patient Information ©2015 ExitCare, LLC. This information is not intended to replace advice given to you by your health care provider. Make sure you discuss any questions you have with your health care provider. ° °

## 2014-02-09 NOTE — Discharge Summary (Signed)
Obstetric Discharge Summary Reason for Admission: onset of labor Prenatal Procedures: NST Intrapartum Procedures: cesarean: low cervical, transverse Postpartum Procedures: none Complications-Operative and Postpartum: none Hemoglobin  Date Value Ref Range Status  02/08/2014 8.9* 12.0 - 15.0 g/dL Final     REPEATED TO VERIFY     DELTA CHECK NOTED     HCT  Date Value Ref Range Status  02/08/2014 27.7* 36.0 - 46.0 % Final   Hospital Course:  [name] is a 23 y.o. female G1P0 with IUP at 2461w1d presenting for contractions since last night, getting stronger at 1200.. Membranes are intact, with active fetal movement.  EDC based on a 24 week us. She did not receive any PNC except for some visits in [facility]U. She was [place] in her [place] and tried to get a TAB when she found out about the pregnancy. However, she was too far along, so now her aunt is adopting the baby.   Pt. Was admitted to hospital in active labor. Subsequently required pLTCS 2/2 failure to descend and failed vacuum delivery. Her intraoperative course was uncomplicated. She had no complications postoperatively. She now has pain controlled, tolerating po, ambulating, +BM, stable and ready for discharge. She is undecided about contraception but planning for LARC and to discuss at her visit.     Physical Exam:  General: alert, cooperative and no distress Lochia: appropriate Uterine Fundus: firm Incision: healing well, no significant drainage, no dehiscence, no significant erythema, slight clear drainage present DVT Evaluation: No evidence of DVT seen on physical exam. No cords or calf tenderness.  Discharge Diagnoses: Term Pregnancy-delivered  Discharge Information: Date: 02/09/2014 Activity: unrestricted and pelvic rest Diet: routine Medications: PNV, Ibuprofen and Percocet Condition: stable Instructions: refer to practice specific booklet Discharge to: home Follow-up Information   [facility] Schedule an appointment as soon as possible for a visit in 4 weeks. (Postoperative Follow up and Contraception )    Specialty:  Obstetrics and Gynecology   Contact information:   [address] [place] [address] [phone]      Newborn Data: Live born female  Birth Weight: 6 lb 14 oz (3118 g) APGAR: 9, 9  Home with Aunt.  Melancon, Caleb G 02/09/2014, 9:18 AM  I have seen and examined this patient and agree with above documentation in the resident's note. SW was involved in the case due to the pt's history and found no barriers to discharge.   Rulon AbideKeli Jaythan Hinely, M.D. [facility] 02/09/2014 9:39 AM

## 2014-02-10 ENCOUNTER — Encounter (HOSPITAL_COMMUNITY): Payer: Self-pay | Admitting: Obstetrics & Gynecology

## 2014-02-10 NOTE — H&P (Signed)
Attestation of Attending Supervision of Advanced Practitioner (CNM/NP): Evaluation and management procedures were performed by the Advanced Practitioner under my supervision and collaboration. I have reviewed the Advanced Practitioner's note and chart, and I agree with the management and plan.  Macaiah Mangal H. 2:03 PM   

## 2014-02-10 NOTE — Discharge Summary (Signed)
Attestation of Attending Supervision of Fellow: Evaluation and management procedures were performed by the Fellow under my supervision and collaboration.  I have reviewed the Fellow's note and chart, and I agree with the management and plan.    

## 2014-02-11 ENCOUNTER — Inpatient Hospital Stay (HOSPITAL_COMMUNITY)
Admission: AD | Admit: 2014-02-11 | Discharge: 2014-02-11 | Disposition: A | Discharge status: Home / Self Care | Payer: Medicaid Other | Source: Ambulatory Visit | Attending: Obstetrics & Gynecology | Admitting: Obstetrics & Gynecology

## 2014-02-11 DIAGNOSIS — O864 Pyrexia of unknown origin following delivery: Secondary | ICD-10-CM | POA: Insufficient documentation

## 2014-02-11 DIAGNOSIS — IMO0002 Reserved for concepts with insufficient information to code with codable children: Secondary | ICD-10-CM | POA: Diagnosis not present

## 2014-02-11 LAB — COMPREHENSIVE METABOLIC PANEL
ALBUMIN: 2.5 g/dL — AB (ref 3.5–5.2)
ALK PHOS: 109 U/L (ref 39–117)
ALT: 14 U/L (ref 0–35)
ANION GAP: 12 (ref 5–15)
AST: 24 U/L (ref 0–37)
BILIRUBIN TOTAL: 0.2 mg/dL — AB (ref 0.3–1.2)
BUN: 14 mg/dL (ref 6–23)
CHLORIDE: 98 meq/L (ref 96–112)
CO2: 27 mEq/L (ref 19–32)
Calcium: 9.1 mg/dL (ref 8.4–10.5)
Creatinine, Ser: 0.46 mg/dL — ABNORMAL LOW (ref 0.50–1.10)
GFR calc Af Amer: >90 mL/min (ref >90)
GFR calc non Af Amer: >90 mL/min (ref >90)
Glucose, Bld: 101 mg/dL — ABNORMAL HIGH (ref 70–99)
POTASSIUM: 4.1 meq/L (ref 3.7–5.3)
Sodium: 137 mEq/L (ref 137–147)
Total Protein: 6.6 g/dL (ref 6.0–8.3)

## 2014-02-11 LAB — URINALYSIS, ROUTINE W REFLEX MICROSCOPIC
BILIRUBIN URINE: NEGATIVE
Glucose, UA: NEGATIVE mg/dL
KETONES UR: NEGATIVE mg/dL
NITRITE: NEGATIVE
PROTEIN: NEGATIVE mg/dL
Specific Gravity, Urine: 1.01 (ref 1.005–1.030)
UROBILINOGEN UA: 1 mg/dL (ref 0.0–1.0)
pH: 8.5 — ABNORMAL HIGH (ref 5.0–8.0)

## 2014-02-11 LAB — URINE MICROSCOPIC-ADD ON

## 2014-02-11 LAB — CBC WITH DIFFERENTIAL/PLATELET
BASOS ABS: 0 10*3/uL (ref 0.0–0.1)
Basophils Relative: 0 % (ref 0–1)
EOS ABS: 0.2 10*3/uL (ref 0.0–0.7)
Eosinophils Relative: 1 % (ref 0–5)
HCT: 29.1 % — ABNORMAL LOW (ref 36.0–46.0)
Hemoglobin: 9.2 g/dL — ABNORMAL LOW (ref 12.0–15.0)
Lymphocytes Relative: 26 % (ref 12–46)
Lymphs Abs: 3.3 10*3/uL (ref 0.7–4.0)
MCH: 23.7 pg — AB (ref 26.0–34.0)
MCHC: 31.6 g/dL (ref 30.0–36.0)
MCV: 74.8 fL — ABNORMAL LOW (ref 78.0–100.0)
Monocytes Absolute: 0.8 10*3/uL (ref 0.1–1.0)
Monocytes Relative: 7 % (ref 3–12)
NEUTROS ABS: 8.2 10*3/uL — AB (ref 1.7–7.7)
NEUTROS PCT: 66 % (ref 43–77)
PLATELETS: 306 10*3/uL (ref 150–400)
RBC: 3.89 MIL/uL (ref 3.87–5.11)
RDW: 16.2 % — AB (ref 11.5–15.5)
WBC: 12.4 10*3/uL — ABNORMAL HIGH (ref 4.0–10.5)

## 2014-02-11 MED ORDER — ACETAMINOPHEN 325 MG PO TABS: 650.0000 mg | ORAL_TABLET | Freq: Four times a day (QID) | ORAL | Status: DC | PRN

## 2014-02-11 NOTE — MAU Provider Note (Signed)
History     CSN: [id number]  Arrival date and time: 02/11/14 4098   First Provider Initiated Contact with Patient 02/11/14 1836      Chief Complaint  Patient presents with  . Leg Swelling  . Fever  . Headache    HPI [name] is a 23 y.o. G1P1001 who presents 4 days post op c/section with fever, headache, & lower extremity swelling.  -Headache since last night. Currently rates 3/10 on pain scale. Has taken ibuprofen with relief. Denies photophobia/visual changes/n/v/epigastric pain.  -Bilateral foot swelling since being discharged home.  -Fever & chills nightly since being discharge home. Has not taken temp at home. Denies dysuria/flank pain/increased incisional pain.  -Mid lower back pain, describes as aching, constant since discharge. Rates as 6/10 on scale. No alleviating or aggravating factors.   Past Medical History  Diagnosis Date  . Medical history non-contributory     Past Surgical History  Procedure Laterality Date  . No past surgeries    . Cesarean section N/A 02/07/2014    Procedure: CESAREAN SECTION;  Surgeon: Lesly Dukes, MD;  Location: [facility];  Service: Obstetrics;  Laterality: N/A;    No family history on file.  History  Substance Use Topics  . Smoking status: Never Smoker   . Smokeless tobacco: Never Used  . Alcohol Use: No    Allergies:  Allergies  Allergen Reactions  . Eggs Or Egg-Derived Products Hives and Itching  . Penicillins Hives and Itching    Prescriptions prior to admission  Medication Sig Dispense Refill  . ibuprofen (ADVIL,MOTRIN) 600 MG tablet Take 1 tablet (600 mg total) by mouth every 6 (six) hours.  30 tablet  0  . oxyCODONE-acetaminophen (PERCOCET/ROXICET) 5-325 MG per tablet Take 1-2 tablets by mouth every 4 (four) hours as needed for severe pain (moderate - severe pain).  30 tablet  0  . Prenatal Vit-Fe Fumarate-FA (PRENATAL MULTIVITAMIN) TABS tablet Take 1 tablet by mouth daily at 12 noon.        Review of Systems   Constitutional: Positive for fever (has not checked temp at home) and chills.  HENT: Negative for hearing loss.   Eyes: Negative for blurred vision and photophobia.  Gastrointestinal: Positive for abdominal pain (incisional pain). Negative for nausea, vomiting, diarrhea and constipation.  Genitourinary: Negative for dysuria and urgency.       Negative for vaginal discharge. Positive for vaginal bleeding.   Neurological: Positive for headaches. Negative for dizziness and sensory change.   Physical Exam   Blood pressure 120/68, pulse 98, temperature 98.8 F (37.1 C), temperature source Oral, resp. rate 16, last menstrual period 06/08/2013, unknown if currently breastfeeding.  Physical Exam  Constitutional: She is oriented to person, place, and time. She appears well-developed and well-nourished. No distress.  Cardiovascular: Normal rate and normal heart sounds.   Respiratory: Effort normal and breath sounds normal. No respiratory distress. She has no wheezes.  GI: Soft.  LTCS incision; honeycomb dressing removed per family request & to evaluate incision. Steri strips remains in place. No erythema or drainage surrounding wound. Dry brown blood noted on several steri strips, appears to be old.   Musculoskeletal: She exhibits edema.       Right ankle: She exhibits swelling. She exhibits normal pulse.       Left ankle: She exhibits swelling. She exhibits normal pulse.  Neurological: She is alert and oriented to person, place, and time.  Reflex Scores:      Patellar reflexes are 3+  on the right side and 3+ on the left side. No clonus  Skin: She is not diaphoretic.    MAU Course  Procedures Results for orders placed during the hospital encounter of 02/11/14 (from the past 24 hour(s))  CBC WITH DIFFERENTIAL     Status: Abnormal   Collection Time    02/11/14  7:06 PM      Result Value Ref Range   WBC 12.4 (*) 4.0 - 10.5 K/uL   RBC 3.89  3.87 - 5.11 MIL/uL   Hemoglobin 9.2 (*) 12.0 -  15.0 g/dL   HCT 40.929.1 (*) 81.136.0 - 91.446.0 %   MCV 74.8 (*) 78.0 - 100.0 fL   MCH 23.7 (*) 26.0 - 34.0 pg   MCHC 31.6  30.0 - 36.0 g/dL   RDW 78.216.2 (*) 95.611.5 - 21.315.5 %   Platelets 306  150 - 400 K/uL   Neutrophils Relative % 66  43 - 77 %   Neutro Abs 8.2 (*) 1.7 - 7.7 K/uL   Lymphocytes Relative 26  12 - 46 %   Lymphs Abs 3.3  0.7 - 4.0 K/uL   Monocytes Relative 7  3 - 12 %   Monocytes Absolute 0.8  0.1 - 1.0 K/uL   Eosinophils Relative 1  0 - 5 %   Eosinophils Absolute 0.2  0.0 - 0.7 K/uL   Basophils Relative 0  0 - 1 %   Basophils Absolute 0.0  0.0 - 0.1 K/uL  COMPREHENSIVE METABOLIC PANEL     Status: Abnormal   Collection Time    02/11/14  7:06 PM      Result Value Ref Range   Sodium 137  137 - 147 mEq/L   Potassium 4.1  3.7 - 5.3 mEq/L   Chloride 98  96 - 112 mEq/L   CO2 27  19 - 32 mEq/L   Glucose, Bld 101 (*) 70 - 99 mg/dL   BUN 14  6 - 23 mg/dL   Creatinine, Ser 0.860.46 (*) 0.50 - 1.10 mg/dL   Calcium 9.1  8.4 - 57.810.5 mg/dL   Total Protein 6.6  6.0 - 8.3 g/dL   Albumin 2.5 (*) 3.5 - 5.2 g/dL   AST 24  0 - 37 U/L   ALT 14  0 - 35 U/L   Alkaline Phosphatase 109  39 - 117 U/L   Total Bilirubin 0.2 (*) 0.3 - 1.2 mg/dL   GFR calc non Af Amer >90  >90 mL/min   GFR calc Af Amer >90  >90 mL/min   Anion gap 12  5 - 15  URINALYSIS, ROUTINE W REFLEX MICROSCOPIC     Status: Abnormal   Collection Time    02/11/14  7:10 PM      Result Value Ref Range   Color, Urine YELLOW  YELLOW   APPearance CLEAR  CLEAR   Specific Gravity, Urine 1.010  1.005 - 1.030   pH 8.5 (*) 5.0 - 8.0   Glucose, UA NEGATIVE  NEGATIVE mg/dL   Hgb urine dipstick LARGE (*) NEGATIVE   Bilirubin Urine NEGATIVE  NEGATIVE   Ketones, ur NEGATIVE  NEGATIVE mg/dL   Protein, ur NEGATIVE  NEGATIVE mg/dL   Urobilinogen, UA 1.0  0.0 - 1.0 mg/dL   Nitrite NEGATIVE  NEGATIVE   Leukocytes, UA SMALL (*) NEGATIVE  URINE MICROSCOPIC-ADD ON     Status: Abnormal   Collection Time    02/11/14  7:10 PM      Result Value Ref Range  Squamous Epithelial / LPF RARE  RARE   WBC, UA 3-6  <3 WBC/hpf   RBC / HPF 3-6  <3 RBC/hpf   Bacteria, UA FEW (*) RARE   Urine-Other MUCOUS PRESENT      MDM Cath urine PCR ordered d/t vaginal bleeding; pt refusing catheter. Changed order to clean catch u/a.  Assessment and Plan  A: 1. Postpartum examination following cesarean delivery     P: Discharge home Discussed reasons to return to MAU Keep scheduled post partum appointment with ob/gyn Take tylenol & ibuprofen PRN pain per bottle instructions  Claudie Revering, Student-NP  02/11/2014, 8:18 PM   I have seen the patient with the resident/student and agree with the above.  Tawnya Crook

## 2014-02-11 NOTE — MAU Note (Signed)
Pt. Had a c-section on July 17th. Pt. Presenting  Today with swelling of legs and interpreter states that she is running temps in the evenings. Pt.s aunt would also like bandage to be removed while here today.

## 2014-02-11 NOTE — Discharge Instructions (Signed)

## 2014-02-11 NOTE — MAU Note (Signed)
Lab called and spoke with Sam about urine. Per Sam will be resulting soon.

## 2014-02-11 NOTE — MAU Note (Signed)
C/s on Fri, d/c on Sunday.  Fever started yesterday (did not actually check it).  Headache started yesterday. Some relief with pain meds.  She not sleeping very well.

## 2014-02-12 ENCOUNTER — Encounter (HOSPITAL_COMMUNITY): Payer: Self-pay | Admitting: Obstetrics & Gynecology

## 2014-02-12 ENCOUNTER — Encounter (HOSPITAL_COMMUNITY): Payer: Self-pay

## 2014-03-10 ENCOUNTER — Telehealth: Payer: Self-pay | Admitting: *Deleted

## 2014-03-10 ENCOUNTER — Ambulatory Visit: Payer: Medicaid Other | Admitting: Medical

## 2014-03-10 NOTE — Telephone Encounter (Signed)
Contacted her friend Selena BattenKim Cothran as directed in chart and informed her that the patient missed an appointment at the clinic.  She states, they think it is tomorrow.  Provided number for patient to call to reschedule appointment. Friend verbalizes understanding.

## 2014-04-07 ENCOUNTER — Ambulatory Visit (INDEPENDENT_AMBULATORY_CARE_PROVIDER_SITE_OTHER): Payer: Medicaid Other | Admitting: Family Medicine

## 2014-04-07 ENCOUNTER — Encounter: Payer: Self-pay | Admitting: Family Medicine

## 2014-04-07 VITALS — BP 127/86 | HR 82 | Temp 98.4°F | Ht 61.0 in | Wt 95.6 lb

## 2014-04-07 DIAGNOSIS — O0932 Supervision of pregnancy with insufficient antenatal care, second trimester: Secondary | ICD-10-CM

## 2014-04-07 DIAGNOSIS — O0930 Supervision of pregnancy with insufficient antenatal care, unspecified trimester: Secondary | ICD-10-CM

## 2014-04-07 MED ORDER — IBUPROFEN 600 MG PO TABS: 600.0000 mg | ORAL_TABLET | Freq: Four times a day (QID) | ORAL | Status: DC

## 2014-04-07 MED ORDER — PRENATAL MULTIVITAMIN CH: 1.0000 | ORAL_TABLET | Freq: Every day | ORAL | Status: DC

## 2014-04-08 NOTE — Progress Notes (Signed)
Subjective:    [name] is a 23 y.o. female who presents to [facility] today:  1.  Discussion about recent pregnancy.  [name] presents today after giving birth via C-section to healthy live newborn female on 02/11/14.  She still has some residual pain Right lower quandrant at surgical scar site. Baby is doing well, has established at [facility].  Patient would like to continue having [facility] care for baby.  If she has concerns with care, will transfer care here.    No vaginal discharge since birth.  Vaginal bleeding has resolved and no bleeding/drainage from surgical scar site.  Eating well per herself and mother.  No N/V.  Of note -- aunt provided translation services.  Although this is not ideal, this was actually the reason for the visit.  The usual Arabic interpreter knows this family well, and as the child was born out of wedlock, this could cause significant scandal in the [place] community here in [place].  Aunt (NOT the one here today) has adopted baby as her own.   ROS as above per HPI, otherwise neg.    PMH reviewed.  Past Medical History  Diagnosis Date  . Medical history non-contributory    Past Surgical History  Procedure Laterality Date  . No past surgeries    . Cesarean section N/A 02/07/2014    Procedure: CESAREAN SECTION;  Surgeon: Lesly Dukes, MD;  Location: [facility];  Service: Obstetrics;  Laterality: N/A;    Medications reviewed. Current Outpatient Prescriptions  Medication Sig Dispense Refill  . ibuprofen (ADVIL,MOTRIN) 600 MG tablet Take 1 tablet (600 mg total) by mouth every 6 (six) hours.  30 tablet  0  . Prenatal Vit-Fe Fumarate-FA (PRENATAL MULTIVITAMIN) TABS tablet Take 1 tablet by mouth daily at 12 noon.  30 tablet  6   No current facility-administered medications for this visit.     Objective:   Physical Exam BP 127/86  Pulse 82  Temp(Src) 98.4 F (36.9 C)  Ht  (1.549 m)  Wt 95 lb 9.6 oz (43.364 kg)  BMI 18.07 kg/m2  LMP 06/08/2013   Breastfeeding? Unknown Gen:  Alert, cooperative patient who appears stated age in no acute distress.  Vital signs reviewed. HEENT: EOMI,  MMM Heart:  RRR ABd:  Soft/NT/ND.  Thin.  Sugical scar site is well healed.  Does have some mild tenderness to palpation RLQ directly over lateral scar site. No mass noted, no guarding or rebound.  Psoas negative   No results found for this or any previous visit (from the past 72 hour(s)).

## 2014-04-08 NOTE — Assessment & Plan Note (Signed)
Patient doing well currently.  I am glad she was able to talk with me about her issues.   She denies any depressive symptoms around her situation.   States she will discuss with me further if mood changes.  She is able to care for baby daily as baby/aunt live in home with patient.

## 2014-04-09 ENCOUNTER — Encounter: Payer: Medicaid Other | Admitting: Obstetrics and Gynecology

## 2014-04-09 NOTE — Progress Notes (Signed)
Patient ID: [name], female   DOB: February 05, 1991, 23 y.o.   MRN: [id number] Patient was not seen by a provider today

## 2014-04-16 ENCOUNTER — Ambulatory Visit: Payer: Medicaid Other

## 2014-05-09 ENCOUNTER — Ambulatory Visit: Payer: Medicaid Other | Attending: Internal Medicine

## 2014-05-26 ENCOUNTER — Encounter: Payer: Self-pay | Admitting: Family Medicine

## 2014-07-16 ENCOUNTER — Ambulatory Visit: Payer: Self-pay | Admitting: Family Medicine

## 2014-08-05 ENCOUNTER — Ambulatory Visit: Payer: Self-pay | Admitting: Family Medicine

## 2014-08-08 ENCOUNTER — Ambulatory Visit: Payer: Self-pay | Admitting: Family Medicine

## 2014-08-11 ENCOUNTER — Ambulatory Visit (INDEPENDENT_AMBULATORY_CARE_PROVIDER_SITE_OTHER): Payer: Medicaid Other | Admitting: Family Medicine

## 2014-08-11 ENCOUNTER — Encounter: Payer: Self-pay | Admitting: Family Medicine

## 2014-08-11 VITALS — BP 130/86 | HR 89 | Temp 99.0°F | Ht 62.0 in | Wt 94.0 lb

## 2014-08-11 DIAGNOSIS — R109 Unspecified abdominal pain: Secondary | ICD-10-CM | POA: Insufficient documentation

## 2014-08-11 DIAGNOSIS — Z603 Acculturation difficulty: Secondary | ICD-10-CM

## 2014-08-11 DIAGNOSIS — R1030 Lower abdominal pain, unspecified: Secondary | ICD-10-CM

## 2014-08-11 MED ORDER — MELOXICAM 7.5 MG PO TABS: 7.5000 mg | ORAL_TABLET | Freq: Every day | ORAL | Status: DC

## 2014-08-11 MED ORDER — PRENATAL MULTIVITAMIN CH: 1.0000 | ORAL_TABLET | Freq: Every day | ORAL | Status: DC

## 2014-08-11 NOTE — Progress Notes (Signed)
Subjective: [name] is a 24 y.o. female presenting to same-day clinic for lower abdominal pain.   This pain is severe, sharp, waxing/waning but constant centralized over RLQ of abdomen/pelvis near c-section scar site from July 2015. This pain has been present since the procedure and is limiting all physical activities. At times it radiates to the left side transversely. Has not tried anything. No fevers, N/V/D, heartburn. Her appetite has been less than usual but her weight (albeit very low) is stable. She also denies feeling hopeless, helpless or worthless and still enjoys her daily life activities.   Of note, as per previous visit, [name]'s Aunt accompanies her and provides translator services. Her other Aunt has essentially adopted her now 546 month old boy.   - Review of Systems: Per HPI.  - She is not sexually active. Objective: BP 130/86 mmHg  Pulse 89  Temp(Src) 99 F (37.2 C) (Oral)  Ht 5\' 2"  (1.575 m)  Wt 94 lb (42.638 kg)  BMI 17.19 kg/m2  LMP 06/08/2013 Gen: Thin 24 y.o. female in no distress HEENT: MMM, posterior oropharynx clear, fair dentition with halitosis.  Pulm: Non-labored; CTAB, no wheezes  CV: Regular rate, no murmur appreciated; distal pulses intact/symmetric GI: Normal bowel sounds; scaphoid abdomen which is soft and non-distanded with significant guarding throughout 4 quadrants. No RLQ localization. No rebound. well healed low transverse midline scar.  Skin: Knuckles without abrasion/erosions.  Neuro: A&Ox3, CN II-XII without deficits  Assessment/Plan: Mary BrineFetema S Mcquilkin is a 24 y.o. female here for lower abdominal pain.

## 2014-08-11 NOTE — Patient Instructions (Signed)
It was nice to meet you!  Please try mobic 1 tablet once per day to help with the pain you are feeling. You can take this at the same time as the vitamin. Also try applying heat to the area.   Make an appointment with Dr. Gwendolyn GrantWalden if this pain does not get better.   Take care! - Dr. Jarvis NewcomerGrunz

## 2014-08-19 NOTE — Assessment & Plan Note (Signed)
Abscess, torsion, cystic disease, ectopic pregnancy, IUP, and appendicitis felt to be unlikely given absence of vital sign abnormality. Will not pursue diagnostic work up at this time due to lack of signs of infection or symptoms of malignancy.  - Symptomatic tx

## 2014-10-28 ENCOUNTER — Ambulatory Visit: Payer: Self-pay | Admitting: Family Medicine

## 2014-11-07 ENCOUNTER — Ambulatory Visit: Payer: Self-pay | Admitting: Family Medicine

## 2014-11-07 ENCOUNTER — Encounter: Payer: Self-pay | Admitting: Family Medicine

## 2014-11-07 ENCOUNTER — Ambulatory Visit (INDEPENDENT_AMBULATORY_CARE_PROVIDER_SITE_OTHER): Payer: Medicaid Other | Admitting: Family Medicine

## 2014-11-07 VITALS — BP 125/69 | HR 85 | Temp 98.1°F | Ht 62.0 in | Wt 94.4 lb

## 2014-11-07 DIAGNOSIS — Z309 Encounter for contraceptive management, unspecified: Secondary | ICD-10-CM | POA: Diagnosis not present

## 2014-11-07 DIAGNOSIS — Z3009 Encounter for other general counseling and advice on contraception: Secondary | ICD-10-CM

## 2014-11-07 DIAGNOSIS — Z30011 Encounter for initial prescription of contraceptive pills: Secondary | ICD-10-CM | POA: Diagnosis not present

## 2014-11-07 LAB — POCT URINE PREGNANCY: PREG TEST UR: NEGATIVE

## 2014-11-07 LAB — POCT URINALYSIS DIPSTICK
Bilirubin, UA: NEGATIVE
Blood, UA: NEGATIVE
Glucose, UA: NEGATIVE
Ketones, UA: NEGATIVE
LEUKOCYTES UA: NEGATIVE
NITRITE UA: NEGATIVE
Protein, UA: NEGATIVE
Spec Grav, UA: 1.01
Urobilinogen, UA: 0.2
pH, UA: 6.5

## 2014-11-07 MED ORDER — NORGESTIMATE-ETH ESTRADIOL 0.25-35 MG-MCG PO TABS: 1.0000 | ORAL_TABLET | Freq: Every day | ORAL | Status: DC

## 2014-11-07 NOTE — Patient Instructions (Signed)
It was a pleasure seeing you today, [name]!  Information regarding what we discussed is included in this packet.   It may take up to 3 months before your menstrual cycles are regular on this medication.  If you feel that you are not tolerating this new medication well after 3 months, come back for an appointment and we can discuss changing it to something else.    Please feel free to call our office at [phone] if any questions or concerns arise.  Warm Regards, [name], DO  Ethinyl Estradiol; Norgestimate tablets What is this medicine? ETHINYL ESTRADIOL; NORGESTIMATE (ETH in il es tra DYE ole; nor JES ti mate) is an oral contraceptive. The products combine two types of female hormones, an estrogen and a progestin. They are used to prevent ovulation and pregnancy. Some products are also used to treat acne in females. This medicine may be used for other purposes; ask your health care provider or pharmacist if you have questions. COMMON BRAND NAME(S): Estarylla, MONO-LINYAH, MonoNessa, Ortho Tri-Cyclen, Ortho Tri-Cyclen Lo, Ortho-Cyclen, Previfem, Sprintec, Tri-Estarylla, TRI-LINYAH, Tri-Lo-Sprintec, Tri-Previfem, Tri-Sprintec, Imelda Pillow What should I tell my health care provider before I take this medicine? They need to know if you have or ever had any of these conditions: -abnormal vaginal bleeding -blood vessel disease or blood clots -breast, cervical, endometrial, ovarian, liver, or uterine cancer -diabetes -gallbladder disease -heart disease or recent heart attack -high blood pressure -high cholesterol -kidney disease -liver disease -migraine headaches -stroke -systemic lupus erythematosus (SLE) -tobacco smoker -an unusual or allergic reaction to estrogens, progestins, other medicines, foods, dyes, or preservatives -pregnant or trying to get pregnant -breast-feeding How should I use this medicine? Take this medicine by mouth. To reduce nausea, this medicine may  be taken with food. Follow the directions on the prescription label. Take this medicine at the same time each day and in the order directed on the package. Do not take your medicine more often than directed. Contact your pediatrician regarding the use of this medicine in children. Special care may be needed. This medicine has been used in female children who have started having menstrual periods. A patient package insert for the product will be given with each prescription and refill. Read this sheet carefully each time. The sheet may change frequently. Overdosage: If you think you have taken too much of this medicine contact a poison control center or emergency room at once. NOTE: This medicine is only for you. Do not share this medicine with others. What if I miss a dose? If you miss a dose, refer to the patient information sheet you received with your medicine for direction. If you miss more than one pill, this medicine may not be as effective and you may need to use another form of birth control. What may interact with this medicine? -acetaminophen -antibiotics or medicines for infections, especially rifampin, rifabutin, rifapentine, and griseofulvin, and possibly penicillins or tetracyclines -aprepitant -ascorbic acid (vitamin C) -atorvastatin -barbiturate medicines, such as phenobarbital -bosentan -carbamazepine -caffeine -clofibrate -cyclosporine -dantrolene -doxercalciferol -felbamate -grapefruit juice -hydrocortisone -medicines for anxiety or sleeping problems, such as diazepam or temazepam -medicines for diabetes, including pioglitazone -mineral oil -modafinil -mycophenolate -nefazodone -oxcarbazepine -phenytoin -prednisolone -ritonavir or other medicines for HIV infection or AIDS -rosuvastatin -selegiline -soy isoflavones supplements -St. John's wort -tamoxifen or raloxifene -theophylline -thyroid hormones -topiramate -warfarin This list may not describe all  possible interactions. Give your health care provider a list of all the medicines, herbs, non-prescription drugs, or dietary supplements you  use. Also tell them if you smoke, drink alcohol, or use illegal drugs. Some items may interact with your medicine. What should I watch for while using this medicine? Visit your doctor or health care professional for regular checks on your progress. You will need a regular breast and pelvic exam and Pap smear while on this medicine. You should also discuss the need for regular mammograms with your health care professional, and follow his or her guidelines for these tests. This medicine can make your body retain fluid, making your fingers, hands, or ankles swell. Your blood pressure can go up. Contact your doctor or health care professional if you feel you are retaining fluid. Use an additional method of contraception during the first cycle that you take these tablets. If you have any reason to think you are pregnant, stop taking this medicine right away and contact your doctor or health care professional. If you are taking this medicine for hormone related problems, it may take several cycles of use to see improvement in your condition. Smoking increases the risk of getting a blood clot or having a stroke while you are taking birth control pills, especially if you are more than 22 years old. You are strongly advised not to smoke. This medicine can make you more sensitive to the sun. Keep out of the sun. If you cannot avoid being in the sun, wear protective clothing and use sunscreen. Do not use sun lamps or tanning beds/booths. If you wear contact lenses and notice visual changes, or if the lenses begin to feel uncomfortable, consult your eye care specialist. In some women, tenderness, swelling, or minor bleeding of the gums may occur. Notify your dentist if this happens. Brushing and flossing your teeth regularly may help limit this. See your dentist regularly and  inform your dentist of the medicines you are taking. If you are going to have elective surgery, you may need to stop taking this medicine before the surgery. Consult your health care professional for advice. This medicine does not protect you against HIV infection (AIDS) or any other sexually transmitted diseases. What side effects may I notice from receiving this medicine? Side effects that you should report to your doctor or health care professional as soon as possible: -breast tissue changes or discharge -changes in vaginal bleeding during your period or between your periods -chest pain -coughing up blood -dizziness or fainting spells -headaches or migraines -leg, arm or groin pain -severe or sudden headaches -stomach pain (severe) -sudden shortness of breath -sudden loss of coordination, especially on one side of the body -speech problems -symptoms of vaginal infection like itching, irritation or unusual discharge -tenderness in the upper abdomen -vomiting -weakness or numbness in the arms or legs, especially on one side of the body -yellowing of the eyes or skin Side effects that usually do not require medical attention (report to your doctor or health care professional if they continue or are bothersome): -breakthrough bleeding and spotting that continues beyond the 3 initial cycles of pills -breast tenderness -mood changes, anxiety, depression, frustration, anger, or emotional outbursts -increased sensitivity to sun or ultraviolet light -nausea -skin rash, acne, or brown spots on the skin -weight gain (slight) This list may not describe all possible side effects. Call your doctor for medical advice about side effects. You may report side effects to FDA at 1-800-FDA-1088. Where should I keep my medicine? Keep out of the reach of children. Store at room temperature between 15 and 30 degrees C (59 and 86  degrees F). Throw away any unused medicine after the expiration date. NOTE:  This sheet is a summary. It may not cover all possible information. If you have questions about this medicine, talk to your doctor, pharmacist, or health care provider.  2015, Elsevier/Gold Standard. (2008-06-26 13:40:47)

## 2014-11-07 NOTE — Assessment & Plan Note (Signed)
Upreg NEGATIVE.  Forms of contraception (including OCP, Depo, Nexplanon, Mirena) discussed with patient, who asked for OCPs. -Rx for Ortho-cyclen and emphasized the importance of not missing doses. -Discussed that the pill does not protect against STDs.  Discussed condom use. -Discussed that only abstinence protects 100% vs pregnancy -Advised that spotting would be normal in the first 3 months.   -Patient encouraged to return if not tolerating medication or decides to use alternative method. -Patient voiced good understanding of above.

## 2014-11-07 NOTE — Progress Notes (Signed)
Patient ID: [name], female   DOB: 01/31/1991, 24 y.o.   MRN: [id number]    Subjective: CC: pregnancy test/ birth control HPI: Patient is a 24 y.o. female presenting to clinic today for same day appointment. Concerns today include:  Patient reports that she is not really here for abdominal pain and that the pain she had experienced had greatly improved.  Patient reports that she has just started being intimate with her boyfriend and she was concerned about pregnancy.  She denies feeling unsafe in her relationship.  She states that she had a normal flow period last month and that she has not missed her period this month, but still voice concern since she and her partner do not use condoms.  She reports being a NON smoker.  She is unsure as to what method she wants for prevention.  Denies concerns for STDs at this time.  No abnormal vaginal bleeding or discharge.  LMP 09/2014.  Social History Reviewed: non smoker. FamHx and MedHx updated.  Please see EMR. Health Maintenance: Pap smear, Tdap DUE  ROS: All other systems reviewed and are negative.  Objective: Office vital signs reviewed. BP 125/69 mmHg  Pulse 85  Temp(Src) 98.1 F (36.7 C) (Oral)  Ht 5\' 2"  (1.575 m)  Wt 94 lb 6.4 oz (42.82 kg)  BMI 17.26 kg/m2  Physical Examination:  General: Awake, alert, thin female, NAD Cardio: RRR, S1S2 heard, no murmurs appreciated Pulm: CTAB, no wheezes, rhonchi or rales, no increased WOB MSK: Normal gait and station  Results for orders placed or performed in visit on 11/07/14 (from the past 24 hour(s))  POCT urinalysis dipstick     Status: None   Collection Time: 11/07/14  4:10 PM  Result Value Ref Range   Color, UA YELLOW    Clarity, UA CLEAR    Glucose, UA NEG    Bilirubin, UA NEG    Ketones, UA NEG    Spec Grav, UA 1.010    Blood, UA NEG    pH, UA 6.5    Protein, UA NEG    Urobilinogen, UA 0.2    Nitrite, UA NEG    Leukocytes, UA Negative   POCT urine pregnancy     Status:  None   Collection Time: 11/07/14  4:10 PM  Result Value Ref Range   Preg Test, Ur Negative    Assessment: 24 y.o. female here for contraception   Plan: See Problem List and After Visit Summary   Raliegh IpAshly M Gottschalk, DO PGY-1, [facility]

## 2014-11-10 NOTE — Progress Notes (Signed)
I was preceptor the day of this visit.   

## 2014-12-01 IMAGING — US US FETAL BPP W/O NONSTRESS
1 series · 13 of 28 positions shown · non-contrast
Comparison: none

OBSTETRICS REPORT
                      (Signed Final 02/03/2014 [DATE])

Service(s) Provided
 [HOSPITAL]                                         76815.0
Indications
 Decreased fetal movement
Fetal Evaluation
 Num Of Fetuses:    1
 Fetal Heart Rate:  136                          bpm
 Cardiac Activity:  Observed
 Presentation:      Cephalic
 Placenta:          Anterior, above cervical os
 P. Cord            Visualized, central
 Insertion:
 Amniotic Fluid
 AFI FV:      Subjectively within normal limits
 AFI Sum:     14.76   cm                     Larg Pckt:    5.18  cm
 RUQ:   5.18    cm   RLQ:    3.01   cm    LUQ:   3.47    cm   LLQ:    3.1    cm
Biophysical Evaluation
 Amniotic F.V:   Pocket => 2 cm two         F. Tone:         Observed
                 planes
 F. Movement:    Observed                   Score:           [DATE]
 F. Breathing:   Observed
Cervix Uterus Adnexa
 Uterus:       No abnormality visualized.
 Cul De Sac:   No free fluid seen.
 Left Ovary:    Not visualized.
 Right Ovary:   Not visualized.
 Adnexa:     Not visualized.
Impression
INDICATION: 23 yr old G1P0 at 03w1d for BPP secondary to
 decreased fetal movement. Remote read.

[Series 1: us fetal bpp w/o nonstress · non-contrast · 28 acquisitions, 13 frames shown]
[im 2/28]
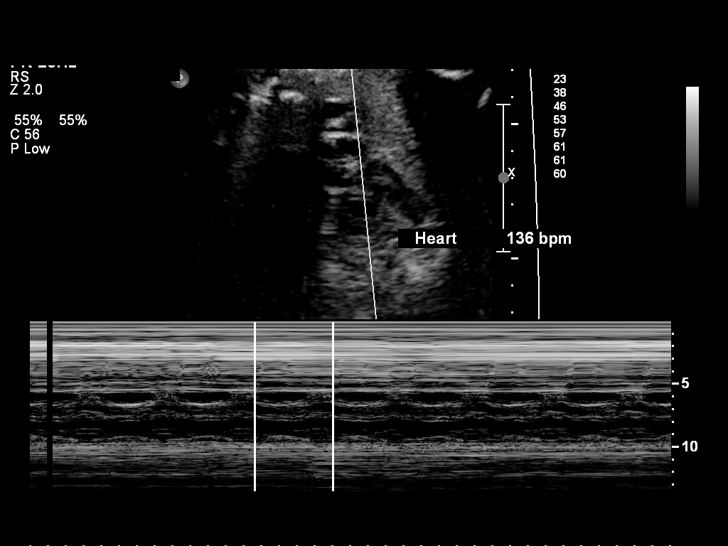
[im 4/28]
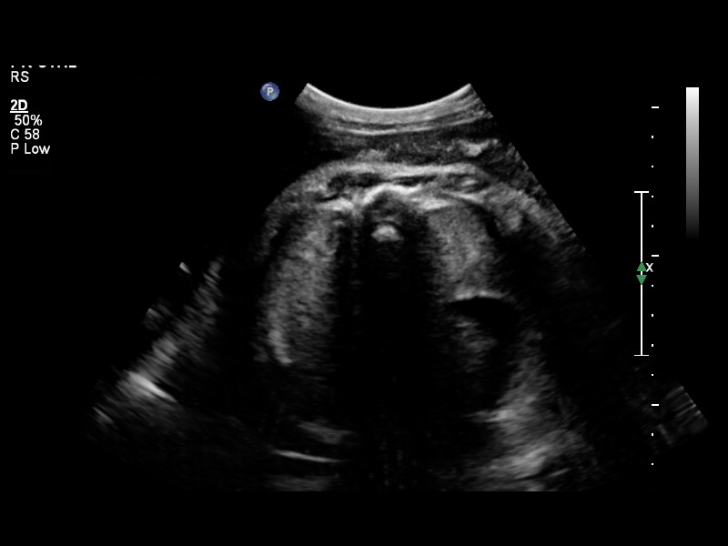
[im 6/28]
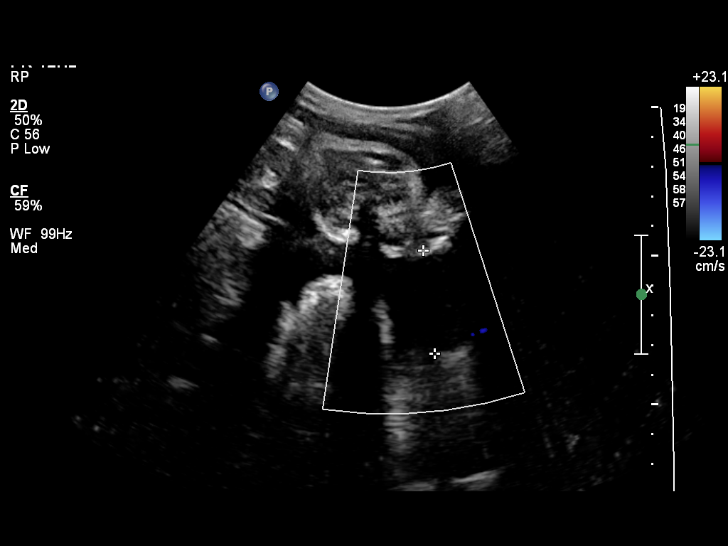
[im 8/28]
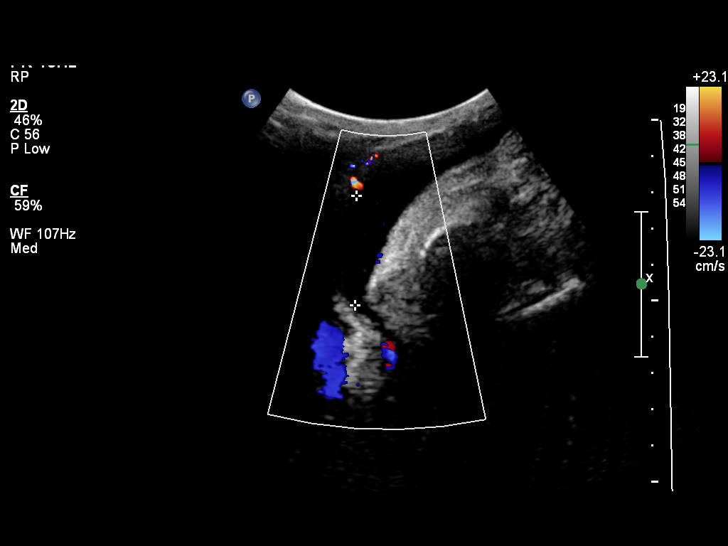
[im 10/28]
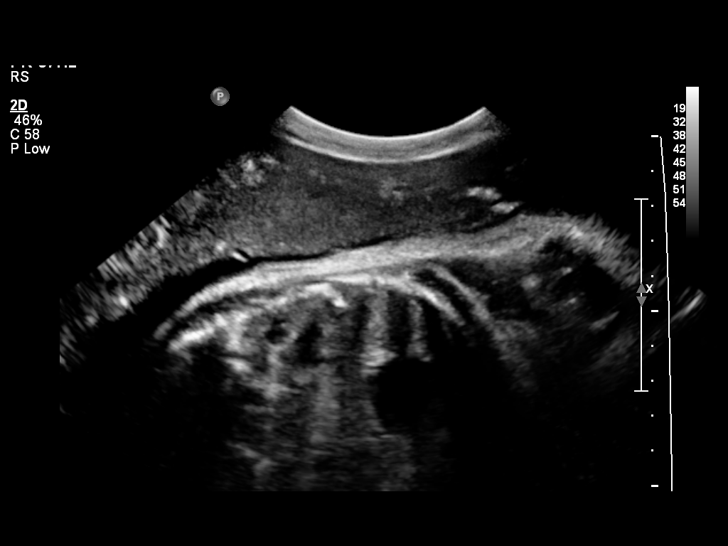
[im 12/28]
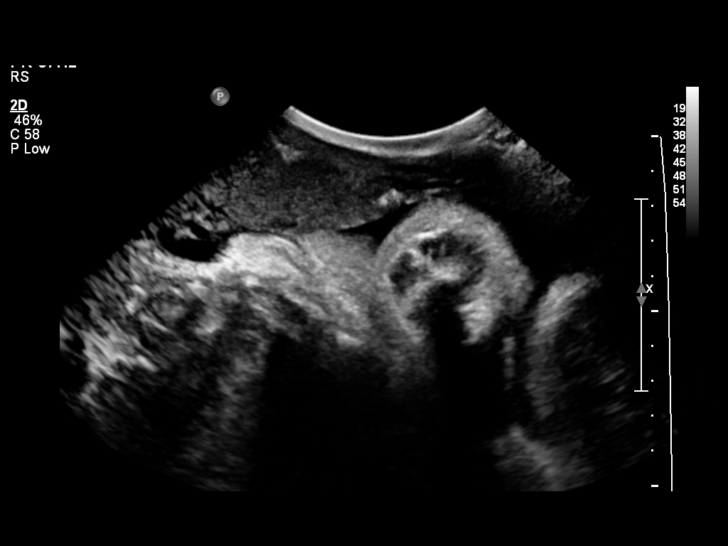
[im 15/28]
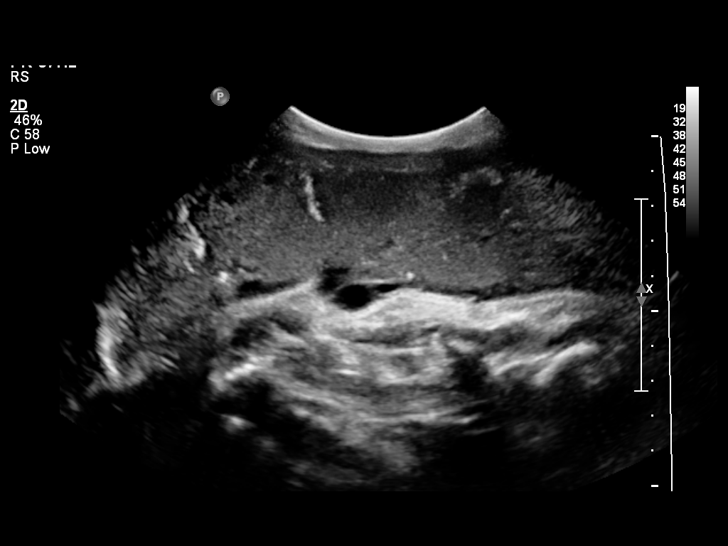
[im 17/28]
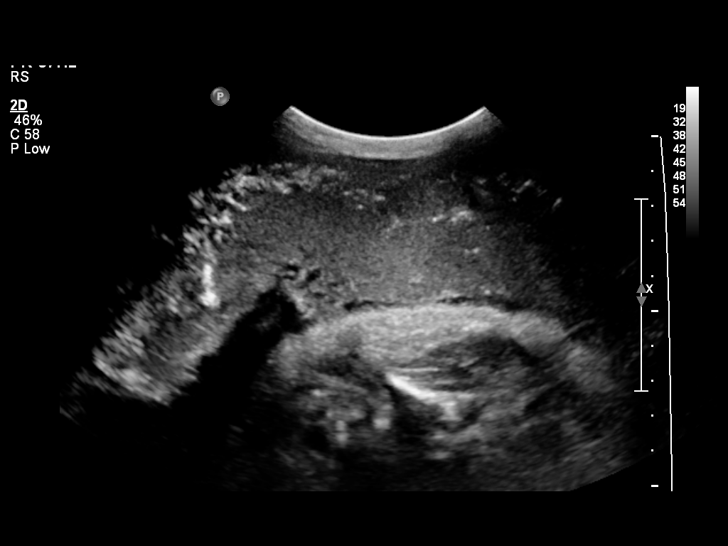
[im 19/28]
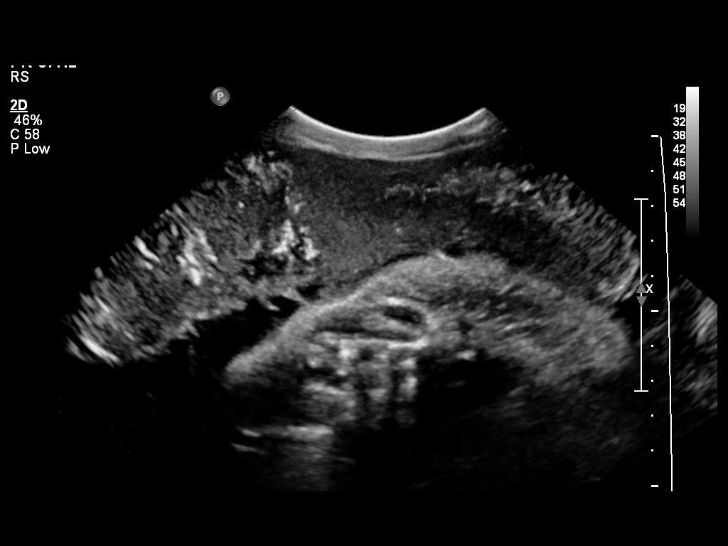
[im 21/28]
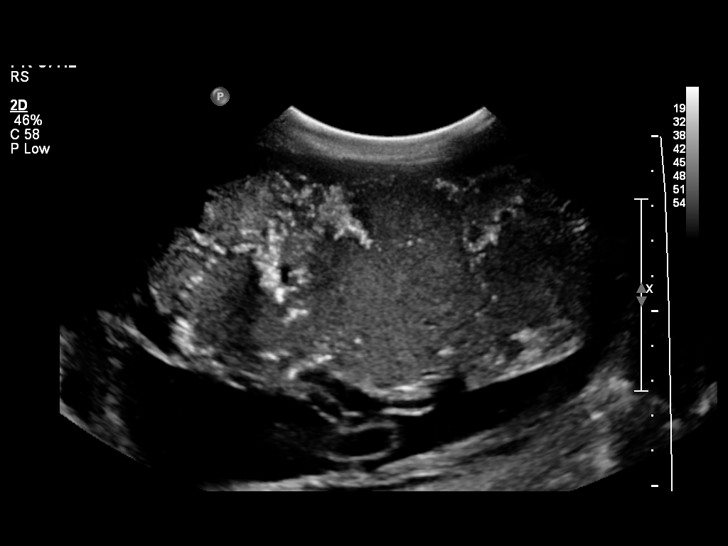
[im 23/28]
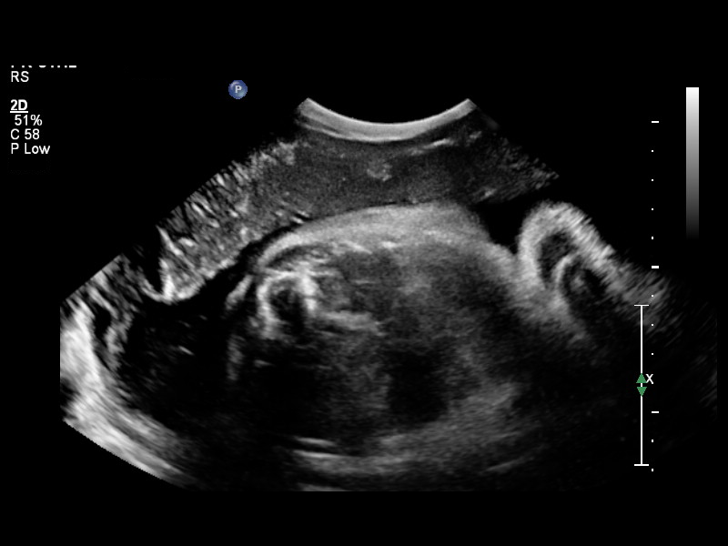
[im 25/28]
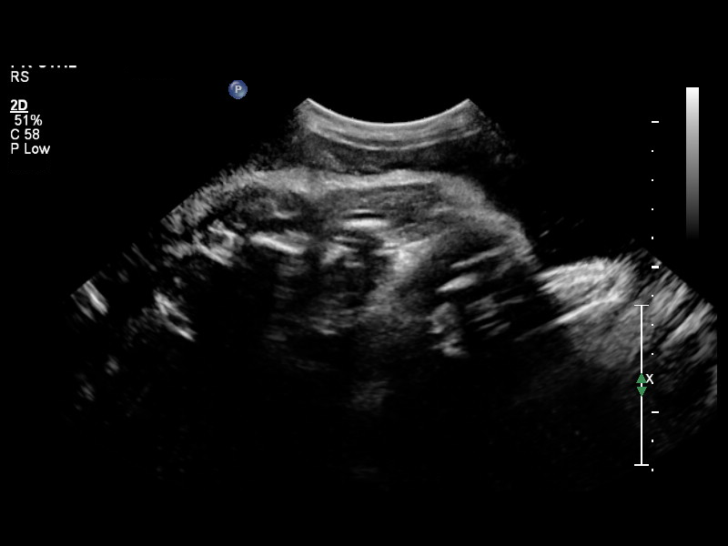
[im 27/28]
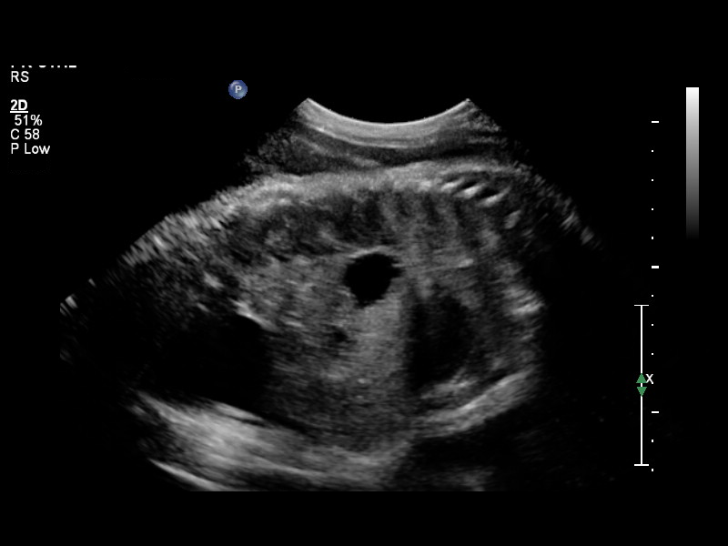

[13 of 28 positions shown; findings below may reference images not displayed]

FINDINGS: 1. Single intrauterine pregnancy.
 2. Anterior placenta without evidence of previa.
 3. Normal amniotic fluid index.
 4. Normal biophysical profile of [DATE].
Recommendations

 1. Normal BPP.
 2. Management per primary OB

 questions or concerns.

## 2015-02-10 ENCOUNTER — Ambulatory Visit: Payer: Medicaid Other | Admitting: Family Medicine

## 2015-02-27 ENCOUNTER — Ambulatory Visit: Payer: Self-pay | Admitting: Family Medicine

## 2015-04-29 ENCOUNTER — Ambulatory Visit: Payer: Self-pay

## 2015-05-05 ENCOUNTER — Ambulatory Visit: Payer: Self-pay | Admitting: Internal Medicine

## 2015-06-02 ENCOUNTER — Ambulatory Visit: Payer: Self-pay | Admitting: Family Medicine

## 2015-06-05 ENCOUNTER — Ambulatory Visit: Payer: Self-pay | Admitting: Family Medicine

## 2015-06-12 ENCOUNTER — Ambulatory Visit: Payer: Self-pay | Admitting: Student

## 2015-06-23 ENCOUNTER — Ambulatory Visit: Payer: Self-pay | Admitting: Family Medicine

## 2015-06-24 ENCOUNTER — Encounter: Payer: Self-pay | Admitting: Family Medicine

## 2015-06-24 ENCOUNTER — Ambulatory Visit (INDEPENDENT_AMBULATORY_CARE_PROVIDER_SITE_OTHER): Payer: BLUE CROSS/BLUE SHIELD | Admitting: Family Medicine

## 2015-06-24 VITALS — BP 118/72 | HR 89 | Temp 98.0°F | Wt 91.0 lb

## 2015-06-24 DIAGNOSIS — O926 Galactorrhea: Secondary | ICD-10-CM | POA: Diagnosis not present

## 2015-06-24 DIAGNOSIS — N643 Galactorrhea not associated with childbirth: Secondary | ICD-10-CM

## 2015-06-24 LAB — TSH: TSH: 2.253 u[IU]/mL (ref 0.350–4.500)

## 2015-06-24 NOTE — Patient Instructions (Signed)
Thank you so much for coming to visit me today! I suspect your breast milk production is normal and exacerbated by your birth control. Please avoid stimulating the breast or nipple as this can increase your breast milk supply. We will check some labs today as well and we will let you know the results.  Thanks again! Dr. Caroleen Hammanumley

## 2015-06-24 NOTE — Assessment & Plan Note (Signed)
-   Counseled that expression of milk with hands may result in increased milk production. - Will check TSH and Prolactin today

## 2015-06-24 NOTE — Progress Notes (Signed)
Subjective:     Patient ID: [name], female   DOB: 09/13/1990, 24 y.o.   MRN: [id number]  HPI [name] is a 24yo female presenting today for galactorrhea. - History of c-section noted in 01/2014 - Did not breast feed. Bottle fed. Denies pumping. - Last noted today - Admits to frequent nipple stimulation, tries to extract milk with hands - Denies erythema, swelling, fevers, pain - Denies blood in milk - Not currently on birth control. States she is trying to conceive again. - Reports regular periods. Expect next period to start in next several days.  Review of Systems Per HPI    Objective:   Physical Exam  Constitutional: She appears well-developed and well-nourished. No distress.  HENT:  Head: Normocephalic and atraumatic.  Pulmonary/Chest:  Normal breast exam. Milk extracted from right and left nipples, white.  Psychiatric: She has a normal mood and affect. Her behavior is normal.       Assessment and Plan:     Galactorrhea - Counseled that expression of milk with hands may result in increased milk production. - Will check TSH and Prolactin today

## 2015-06-25 LAB — PROLACTIN: Prolactin: 8.6 ng/mL

## 2015-06-26 ENCOUNTER — Telehealth: Payer: Self-pay | Admitting: Family Medicine

## 2015-06-26 ENCOUNTER — Encounter: Payer: Self-pay | Admitting: *Deleted

## 2015-06-26 NOTE — Telephone Encounter (Signed)
Mailed letter. Fleeger, Jessica Dawn  

## 2015-06-26 NOTE — Telephone Encounter (Signed)
Attempted to contact x2 without response. Please let her know that her labs obtained at her last visit were normal.

## 2015-11-24 ENCOUNTER — Ambulatory Visit: Payer: Self-pay | Admitting: Family Medicine

## 2015-11-25 ENCOUNTER — Other Ambulatory Visit: Payer: Self-pay | Admitting: Family Medicine

## 2015-11-25 ENCOUNTER — Encounter: Payer: Self-pay | Admitting: Family Medicine

## 2015-11-25 ENCOUNTER — Ambulatory Visit (INDEPENDENT_AMBULATORY_CARE_PROVIDER_SITE_OTHER): Payer: BLUE CROSS/BLUE SHIELD | Admitting: Family Medicine

## 2015-11-25 VITALS — BP 117/62 | HR 84 | Temp 97.4°F | Ht 62.0 in | Wt 93.6 lb

## 2015-11-25 DIAGNOSIS — R630 Anorexia: Secondary | ICD-10-CM | POA: Diagnosis not present

## 2015-11-25 DIAGNOSIS — R636 Underweight: Secondary | ICD-10-CM | POA: Diagnosis not present

## 2015-11-25 DIAGNOSIS — R0600 Dyspnea, unspecified: Secondary | ICD-10-CM

## 2015-11-25 LAB — CBC
HCT: 40.2 % (ref 35.0–45.0)
HEMOGLOBIN: 13.4 g/dL (ref 11.7–15.5)
MCH: 29.4 pg (ref 27.0–33.0)
MCHC: 33.3 g/dL (ref 32.0–36.0)
MCV: 88.2 fL (ref 80.0–100.0)
MPV: 9.6 fL (ref 7.5–12.5)
PLATELETS: 187 10*3/uL (ref 140–400)
RBC: 4.56 MIL/uL (ref 3.80–5.10)
RDW: 12.8 % (ref 11.0–15.0)
WBC: 7.5 10*3/uL (ref 3.8–10.8)

## 2015-11-25 LAB — COMPLETE METABOLIC PANEL WITH GFR
ALT: 13 U/L (ref 6–29)
AST: 17 U/L (ref 10–30)
Albumin: 4.3 g/dL (ref 3.6–5.1)
Alkaline Phosphatase: 50 U/L (ref 33–115)
BUN: 26 mg/dL — AB (ref 7–25)
CO2: 25 mmol/L (ref 20–31)
Calcium: 9.4 mg/dL (ref 8.6–10.2)
Chloride: 104 mmol/L (ref 98–110)
Creat: 0.58 mg/dL (ref 0.50–1.10)
GFR, Est African American: >89 mL/min (ref >=60)
Glucose, Bld: 80 mg/dL (ref 65–99)
Potassium: 4.3 mmol/L (ref 3.5–5.3)
SODIUM: 138 mmol/L (ref 135–146)
Total Bilirubin: 0.3 mg/dL (ref 0.2–1.2)
Total Protein: 6.9 g/dL (ref 6.1–8.1)

## 2015-11-25 LAB — POCT URINE PREGNANCY: PREG TEST UR: NEGATIVE

## 2015-11-25 NOTE — Assessment & Plan Note (Signed)
Though her BMI is 17.12, she has maintained weight (actually ~3 lbs up) from last visit and pre-pregnancy in 2015. Patient does not have a history or eating disorder, and does not meet any definite criteria for AN, though previous trauma may be at play. No symptoms suggesting malabsorption or malignancy. Recently normal TSH, HIV nonreactive 2015. Most troubling during this visit is dyspnea on exertion, though no other sequelae of HF or pulmonary pathology on history or exam.  - Check CBC to r/o infection/anemia.  - Check UPreg, CMP to evaluate random glucose, renal and hepatic function, albumin as corollary to nutritional status.  - Follow up in 2 weeks with PCP or earlier as indicated by testing today. Could consider PHQ at that time.

## 2015-11-25 NOTE — Patient Instructions (Signed)
Thank you for coming in today!  We are checking some labs today, and we'll call you if they are abnormal. If you do not hear from me by phone or letter in 2 weeks, please call us as we may have been unable to reach you.   Please schedule a follow up appointment to discuss this further in about 2 weeks.   Our clinic's number is [phone]. Feel free to call any time with questions or concerns. We will answer any questions after hours with our 24-hour emergency line at that number as well.   - Dr. Jarvis NewcomerGrunz

## 2015-11-25 NOTE — Progress Notes (Signed)
Subjective: [name] is a 25 y.o. female presenting for loss of appetite.   She reports feeling more tired in general and having loss of appetite over the past month. This occurred gradually. 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 She denies any recent illnesses, nausea, vomiting, diarrhea, abdominal pain, difficulty or pain with swallowing. Further denies fevers, chills, night sweats, chest pain, palpitations, leg swelling, orthopnea, PND. She has noticed some shortness of breath with moderate exertion which is new. She denies intentional restrictive eating patterns, in fact she is concerned over her recent weight loss. She does not exercise regularly. Does not have a history of eating disorder. She was last sexually active 2 weeks ago, using no barrier or hormonal contraception. Denies vaginal bleeding or discharge.   Note, patient speaks English as a second language, so a video Arabic interpretor was used for clarification as needed throughout encounter.  - ROS: As above - Non-smoker   Objective: BP 117/62 mmHg  Pulse 84  Temp(Src) 97.4 F (36.3 C) (Oral)  Ht 5\' 2"  (1.575 m)  Wt 93 lb 9.6 oz (42.457 kg)  BMI 17.12 kg/m2  LMP 10/30/2015 Gen: Thin, pleasant 25 y.o. female in no distress HEENT: PERRL, MMM, posterior oropharynx clear, fair dentition Pulm: Non-labored; CTAB, no wheezes  CV: Regular rate, no murmur; distal pulses intact/symmetric; no LE edema GI: Scaphoid, + BS; soft, non-tender, non-distended, no HSM Skin: No rashes, wounds, ulcers MSK: Normal gait and station; no digital clubbing/cyanosis Psych: A&Ox3, mood "fine" with congruent affect, speech clear and coherent  Assessment/Plan: Mary BrineFetema S Mcclaran is a [age] y.o. female here for fatigue, appetite loss, and dyspnea.  Underweight Though her BMI is 17.12, she has maintained weight (actually ~3 lbs up) from last visit and  pre-pregnancy in 2015. Patient does not have a history or eating disorder, and does not meet any definite criteria for AN, though previous trauma may be at play. No symptoms suggesting malabsorption or malignancy. Recently normal TSH, HIV nonreactive 2015. Most troubling during this visit is dyspnea on exertion, though no other sequelae of HF or pulmonary pathology on history or exam.  - Check CBC to r/o infection/anemia.  - Check UPreg, CMP to evaluate random glucose, renal and hepatic function, albumin as corollary to nutritional status.  - Follow up in 2 weeks with PCP or earlier as indicated by testing today. Could consider PHQ at that time.

## 2015-11-26 ENCOUNTER — Other Ambulatory Visit: Payer: Self-pay | Admitting: *Deleted

## 2015-11-26 DIAGNOSIS — Z30011 Encounter for initial prescription of contraceptive pills: Secondary | ICD-10-CM

## 2015-11-29 MED ORDER — NORGESTIMATE-ETH ESTRADIOL 0.25-35 MG-MCG PO TABS: 1.0000 | ORAL_TABLET | Freq: Every day | ORAL | Status: AC

## 2015-11-30 ENCOUNTER — Encounter: Payer: Self-pay | Admitting: Family Medicine

## 2015-12-22 ENCOUNTER — Ambulatory Visit: Payer: Self-pay | Admitting: Family Medicine

## 2016-05-03 ENCOUNTER — Other Ambulatory Visit: Payer: Self-pay | Admitting: Family Medicine

## 2016-05-03 MED ORDER — PRENATAL PLUS 27-1 MG PO TABS: ORAL_TABLET | ORAL | 2 refills | Status: AC

## 2016-05-17 ENCOUNTER — Ambulatory Visit: Payer: Self-pay | Admitting: Family Medicine
